# Patient Record
Sex: Male | Born: 1960
Health system: Southern US, Community
[De-identification: ages and names within clinical notes are randomized; demographics above are authoritative.]

## PROBLEM LIST (undated history)

## (undated) DIAGNOSIS — K259 Gastric ulcer, unspecified as acute or chronic, without hemorrhage or perforation: Secondary | ICD-10-CM

## (undated) DIAGNOSIS — E119 Type 2 diabetes mellitus without complications: Secondary | ICD-10-CM

## (undated) DIAGNOSIS — R011 Cardiac murmur, unspecified: Secondary | ICD-10-CM

## (undated) DIAGNOSIS — M7542 Impingement syndrome of left shoulder: Secondary | ICD-10-CM

## (undated) DIAGNOSIS — M75122 Complete rotator cuff tear or rupture of left shoulder, not specified as traumatic: Secondary | ICD-10-CM

## (undated) DIAGNOSIS — G8929 Other chronic pain: Secondary | ICD-10-CM

## (undated) DIAGNOSIS — Z7189 Other specified counseling: Secondary | ICD-10-CM

## (undated) DIAGNOSIS — I350 Nonrheumatic aortic (valve) stenosis: Secondary | ICD-10-CM

## (undated) DIAGNOSIS — E785 Hyperlipidemia, unspecified: Secondary | ICD-10-CM

## (undated) DIAGNOSIS — F17200 Nicotine dependence, unspecified, uncomplicated: Secondary | ICD-10-CM

## (undated) DIAGNOSIS — J449 Chronic obstructive pulmonary disease, unspecified: Secondary | ICD-10-CM

## (undated) DIAGNOSIS — M19012 Primary osteoarthritis, left shoulder: Secondary | ICD-10-CM

## (undated) DIAGNOSIS — K449 Diaphragmatic hernia without obstruction or gangrene: Secondary | ICD-10-CM

## (undated) DIAGNOSIS — M549 Dorsalgia, unspecified: Secondary | ICD-10-CM

## (undated) DIAGNOSIS — I10 Essential (primary) hypertension: Secondary | ICD-10-CM

## (undated) HISTORY — DX: Diaphragmatic hernia without obstruction or gangrene: K44.9

## (undated) HISTORY — DX: Hyperlipidemia, unspecified: E78.5

## (undated) HISTORY — DX: Type 2 diabetes mellitus without complications: E11.9

## (undated) HISTORY — DX: Nonrheumatic aortic (valve) stenosis: I35.0

## (undated) HISTORY — DX: Chronic obstructive pulmonary disease, unspecified: J44.9

## (undated) HISTORY — DX: Essential (primary) hypertension: I10

## (undated) HISTORY — PX: OTHER SURGICAL HISTORY: SHX169

---

## 1998-11-13 ENCOUNTER — Emergency Department (HOSPITAL_COMMUNITY): Admission: EM | Admit: 1998-11-13 | Discharge: 1998-11-13 | Payer: Self-pay | Admitting: Emergency Medicine

## 1999-02-24 ENCOUNTER — Emergency Department (HOSPITAL_COMMUNITY): Admission: EM | Admit: 1999-02-24 | Discharge: 1999-02-24 | Payer: Self-pay | Admitting: Emergency Medicine

## 1999-02-24 ENCOUNTER — Encounter: Payer: Self-pay | Admitting: Emergency Medicine

## 2000-05-05 ENCOUNTER — Ambulatory Visit (HOSPITAL_COMMUNITY): Admission: RE | Admit: 2000-05-05 | Discharge: 2000-05-05 | Payer: Self-pay | Admitting: *Deleted

## 2000-05-05 ENCOUNTER — Emergency Department (HOSPITAL_COMMUNITY): Admission: EM | Admit: 2000-05-05 | Discharge: 2000-05-05 | Payer: Self-pay | Admitting: *Deleted

## 2000-05-05 ENCOUNTER — Encounter: Payer: Self-pay | Admitting: *Deleted

## 2000-05-06 ENCOUNTER — Encounter (HOSPITAL_COMMUNITY): Admission: RE | Admit: 2000-05-06 | Discharge: 2000-08-04 | Payer: Self-pay | Admitting: *Deleted

## 2001-06-01 ENCOUNTER — Emergency Department (HOSPITAL_COMMUNITY): Admission: EM | Admit: 2001-06-01 | Discharge: 2001-06-01 | Payer: Self-pay | Admitting: Emergency Medicine

## 2002-08-27 ENCOUNTER — Encounter: Payer: Self-pay | Admitting: Emergency Medicine

## 2002-08-27 ENCOUNTER — Emergency Department (HOSPITAL_COMMUNITY): Admission: EM | Admit: 2002-08-27 | Discharge: 2002-08-27 | Payer: Self-pay | Admitting: Emergency Medicine

## 2002-11-12 ENCOUNTER — Emergency Department (HOSPITAL_COMMUNITY): Admission: EM | Admit: 2002-11-12 | Discharge: 2002-11-12 | Payer: Self-pay | Admitting: Emergency Medicine

## 2002-11-12 ENCOUNTER — Encounter: Payer: Self-pay | Admitting: Emergency Medicine

## 2002-12-26 ENCOUNTER — Emergency Department (HOSPITAL_COMMUNITY): Admission: EM | Admit: 2002-12-26 | Discharge: 2002-12-26 | Payer: Self-pay | Admitting: Emergency Medicine

## 2003-01-14 ENCOUNTER — Emergency Department (HOSPITAL_COMMUNITY): Admission: EM | Admit: 2003-01-14 | Discharge: 2003-01-14 | Payer: Self-pay | Admitting: Emergency Medicine

## 2004-10-05 ENCOUNTER — Emergency Department (HOSPITAL_COMMUNITY): Admission: EM | Admit: 2004-10-05 | Discharge: 2004-10-05 | Payer: Self-pay | Admitting: Emergency Medicine

## 2006-06-12 ENCOUNTER — Observation Stay (HOSPITAL_COMMUNITY): Admission: EM | Admit: 2006-06-12 | Discharge: 2006-06-13 | Payer: Self-pay | Admitting: *Deleted

## 2006-06-12 ENCOUNTER — Encounter: Payer: Self-pay | Admitting: Cardiology

## 2006-06-13 ENCOUNTER — Encounter: Payer: Self-pay | Admitting: Cardiology

## 2006-06-13 ENCOUNTER — Encounter: Payer: Self-pay | Admitting: Interventional Cardiology

## 2008-12-10 ENCOUNTER — Emergency Department (HOSPITAL_COMMUNITY): Admission: EM | Admit: 2008-12-10 | Discharge: 2008-12-10 | Payer: Self-pay | Admitting: Emergency Medicine

## 2009-01-03 ENCOUNTER — Emergency Department (HOSPITAL_COMMUNITY): Admission: EM | Admit: 2009-01-03 | Discharge: 2009-01-03 | Payer: Self-pay | Admitting: Emergency Medicine

## 2009-07-09 ENCOUNTER — Emergency Department (HOSPITAL_COMMUNITY): Admission: EM | Admit: 2009-07-09 | Discharge: 2009-07-09 | Payer: Self-pay | Admitting: Emergency Medicine

## 2009-12-23 ENCOUNTER — Emergency Department (HOSPITAL_COMMUNITY): Admission: EM | Admit: 2009-12-23 | Discharge: 2009-12-23 | Payer: Self-pay | Admitting: Emergency Medicine

## 2010-01-28 ENCOUNTER — Emergency Department (HOSPITAL_COMMUNITY): Admission: EM | Admit: 2010-01-28 | Discharge: 2010-01-28 | Payer: Self-pay | Admitting: Emergency Medicine

## 2010-02-16 ENCOUNTER — Emergency Department (HOSPITAL_COMMUNITY): Admission: EM | Admit: 2010-02-16 | Discharge: 2010-02-16 | Payer: Self-pay | Admitting: Emergency Medicine

## 2010-04-27 ENCOUNTER — Emergency Department (HOSPITAL_COMMUNITY): Admission: EM | Admit: 2010-04-27 | Discharge: 2010-04-27 | Payer: Self-pay | Admitting: Emergency Medicine

## 2010-05-13 ENCOUNTER — Encounter: Payer: Self-pay | Admitting: Cardiology

## 2010-06-30 ENCOUNTER — Encounter: Payer: Self-pay | Admitting: Cardiology

## 2010-07-13 ENCOUNTER — Encounter (INDEPENDENT_AMBULATORY_CARE_PROVIDER_SITE_OTHER): Payer: Self-pay | Admitting: *Deleted

## 2010-07-13 ENCOUNTER — Ambulatory Visit: Payer: Self-pay | Admitting: Cardiology

## 2010-07-13 DIAGNOSIS — E669 Obesity, unspecified: Secondary | ICD-10-CM

## 2010-07-13 DIAGNOSIS — F17200 Nicotine dependence, unspecified, uncomplicated: Secondary | ICD-10-CM

## 2010-07-13 DIAGNOSIS — I10 Essential (primary) hypertension: Secondary | ICD-10-CM

## 2010-07-13 DIAGNOSIS — R011 Cardiac murmur, unspecified: Secondary | ICD-10-CM

## 2010-07-13 DIAGNOSIS — R072 Precordial pain: Secondary | ICD-10-CM

## 2010-07-13 DIAGNOSIS — R0602 Shortness of breath: Secondary | ICD-10-CM

## 2010-07-13 DIAGNOSIS — Z87442 Personal history of urinary calculi: Secondary | ICD-10-CM

## 2010-07-20 ENCOUNTER — Telehealth (INDEPENDENT_AMBULATORY_CARE_PROVIDER_SITE_OTHER): Payer: Self-pay | Admitting: Radiology

## 2010-07-21 ENCOUNTER — Ambulatory Visit: Payer: Self-pay

## 2010-07-21 ENCOUNTER — Encounter (HOSPITAL_COMMUNITY): Admission: RE | Admit: 2010-07-21 | Discharge: 2010-10-02 | Payer: Self-pay | Admitting: Cardiology

## 2010-07-21 ENCOUNTER — Ambulatory Visit (HOSPITAL_COMMUNITY): Admission: RE | Admit: 2010-07-21 | Discharge: 2010-07-21 | Payer: Self-pay | Admitting: Cardiology

## 2010-07-21 ENCOUNTER — Encounter: Payer: Self-pay | Admitting: Cardiology

## 2010-07-21 ENCOUNTER — Ambulatory Visit: Payer: Self-pay | Admitting: Cardiology

## 2010-07-22 ENCOUNTER — Encounter: Payer: Self-pay | Admitting: Cardiology

## 2010-07-24 ENCOUNTER — Telehealth: Payer: Self-pay | Admitting: Cardiology

## 2010-08-05 ENCOUNTER — Encounter: Payer: Self-pay | Admitting: Cardiology

## 2010-08-10 ENCOUNTER — Ambulatory Visit: Payer: Self-pay | Admitting: Cardiology

## 2010-08-19 ENCOUNTER — Emergency Department (HOSPITAL_COMMUNITY): Admission: EM | Admit: 2010-08-19 | Discharge: 2010-08-19 | Payer: Self-pay | Admitting: Emergency Medicine

## 2010-11-09 ENCOUNTER — Emergency Department (HOSPITAL_COMMUNITY)
Admission: EM | Admit: 2010-11-09 | Discharge: 2010-11-09 | Payer: Self-pay | Source: Home / Self Care | Admitting: Emergency Medicine

## 2010-11-13 ENCOUNTER — Ambulatory Visit: Admit: 2010-11-13 | Payer: Self-pay | Admitting: Vascular Surgery

## 2010-11-16 LAB — URINALYSIS, ROUTINE W REFLEX MICROSCOPIC
Bilirubin Urine: NEGATIVE
Hgb urine dipstick: NEGATIVE
Ketones, ur: NEGATIVE mg/dL
Nitrite: NEGATIVE
Protein, ur: NEGATIVE mg/dL
Specific Gravity, Urine: >1.03 — ABNORMAL HIGH (ref 1.005–1.030)
Urine Glucose, Fasting: NEGATIVE mg/dL
Urobilinogen, UA: 0.2 mg/dL (ref 0.0–1.0)
pH: 5.5 (ref 5.0–8.0)

## 2010-11-22 ENCOUNTER — Encounter: Payer: Self-pay | Admitting: Physical Medicine and Rehabilitation

## 2010-11-22 ENCOUNTER — Emergency Department (HOSPITAL_COMMUNITY)
Admission: EM | Admit: 2010-11-22 | Discharge: 2010-11-22 | Payer: Self-pay | Source: Home / Self Care | Admitting: Emergency Medicine

## 2010-11-24 LAB — CBC
HCT: 46.1 % (ref 39.0–52.0)
Hemoglobin: 16.9 g/dL (ref 13.0–17.0)
MCH: 32.8 pg (ref 26.0–34.0)
MCHC: 36.7 g/dL — ABNORMAL HIGH (ref 30.0–36.0)
MCV: 89.3 fL (ref 78.0–100.0)
Platelets: 274 10*3/uL (ref 150–400)
RBC: 5.16 MIL/uL (ref 4.22–5.81)
RDW: 12 % (ref 11.5–15.5)
WBC: 14.2 10*3/uL — ABNORMAL HIGH (ref 4.0–10.5)

## 2010-11-24 LAB — COMPREHENSIVE METABOLIC PANEL
ALT: 36 U/L (ref 0–53)
AST: 24 U/L (ref 0–37)
Albumin: 4.5 g/dL (ref 3.5–5.2)
Alkaline Phosphatase: 65 U/L (ref 39–117)
BUN: 14 mg/dL (ref 6–23)
CO2: 27 mEq/L (ref 19–32)
Calcium: 10.3 mg/dL (ref 8.4–10.5)
Chloride: 100 mEq/L (ref 96–112)
Creatinine, Ser: 1.05 mg/dL (ref 0.4–1.5)
GFR calc Af Amer: >60 mL/min (ref >60)
GFR calc non Af Amer: >60 mL/min (ref >60)
Glucose, Bld: 107 mg/dL — ABNORMAL HIGH (ref 70–99)
Potassium: 3.9 mEq/L (ref 3.5–5.1)
Sodium: 136 mEq/L (ref 135–145)
Total Bilirubin: 0.8 mg/dL (ref 0.3–1.2)
Total Protein: 8 g/dL (ref 6.0–8.3)

## 2010-11-24 LAB — RAPID URINE DRUG SCREEN, HOSP PERFORMED
Amphetamines: NOT DETECTED
Barbiturates: NOT DETECTED
Benzodiazepines: NOT DETECTED
Cocaine: NOT DETECTED
Opiates: POSITIVE — AB
Tetrahydrocannabinol: NOT DETECTED

## 2010-11-24 LAB — D-DIMER, QUANTITATIVE: D-Dimer, Quant: 0.32 ug/mL-FEU (ref 0.00–0.48)

## 2010-11-24 LAB — TROPONIN I: Troponin I: 0.02 ng/mL (ref 0.00–0.06)

## 2010-11-24 LAB — CK TOTAL AND CKMB (NOT AT ARMC)
CK, MB: 2.8 ng/mL (ref 0.3–4.0)
Relative Index: 1.4 (ref 0.0–2.5)
Total CK: 202 U/L (ref 7–232)

## 2010-12-01 ENCOUNTER — Other Ambulatory Visit: Payer: Self-pay | Admitting: Family Medicine

## 2010-12-01 DIAGNOSIS — M545 Low back pain: Secondary | ICD-10-CM

## 2010-12-01 NOTE — Assessment & Plan Note (Signed)
Summary: Nuclear Study     Nuclear Med Background Indications for Stress Test: Evaluation for Ischemia   History: Myocardial Perfusion Study  History Comments: 2007-MPS- No ischemia. EF= 56%  Symptoms: Chest Pain, Chest Pain with Exertion, Chest Tightness with Exertion, Diaphoresis, Dizziness, DOE    Nuclear Pre-Procedure Cardiac Risk Factors: Family History - CAD, Hypertension, Smoker Caffeine/Decaff Intake: None NPO After: 6:00 PM Lungs: clear IV 0.9% NS with Angio Cath: 22g     IV Site: R Hand IV Started by: Irean Hong, RN Chest Size (in) 54     Height (in): 73 Weight (lb): 268 BMI: 35.49 Tech Comments: Held metoprolol 24 hrs.  Nuclear Med Study 1 or 2 day study:  1 day     Stress Test Type:  Treadmill/Lexiscan Reading MD:  Marca Ancona, MD     Referring MD:  J. Hochrein,MD Resting Radionuclide:  Technetium 13m Tetrofosmin     Resting Radionuclide Dose:  10.9 mCi  Stress Radionuclide:  Technetium 63m Tetrofosmin     Stress Radionuclide Dose:  33.0 mCi   Stress Protocol Exercise Time (min):  2:00 min     Max HR:  121 bpm     Predicted Max HR:  171 bpm  Max Systolic BP: 184 mm Hg     Percent Max HR:  70.76 %     METS: 1.6 Rate Pressure Product:  [address]  Lexiscan: 0.4 mg   Stress Test Technologist:  Cathlyn Parsons, RN     Nuclear Technologist:  Doyne Keel, CNMT  Rest Procedure  Myocardial perfusion imaging was performed at rest 45 minutes following the intravenous administration of Technetium 78m Tetrofosmin.  Stress Procedure  The patient received IV Lexiscan 0.4 mg over 15-seconds with concurrent low level exercise and then Technetium 62m Tetrofosmin was injected at 30-seconds while the patient continued walking one more minute.  There were no significant changes with Lexiscan. Patient had chest pressure 6-7/10 and SOB with infusion.  Symptoms relieved in recovery.  Patient had mild hypertensive response.  Quantitative spect images were obtained after a 45  minute delay.  QPS Raw Data Images:  Mild diaphragmatic attenuation.  Normal left ventricular size. Stress Images:  Normal homogeneous uptake in all areas of the myocardium. Rest Images:  Normal homogeneous uptake in all areas of the myocardium. Subtraction (SDS):  There is no evidence of scar or ischemia. Transient Ischemic Dilatation:  1.03  (Normal <1.22)  Lung/Heart Ratio:  .35  (Normal <0.45)  Quantitative Gated Spect Images QGS EDV:  130 ml QGS ESV:  47 ml QGS EF:  64 % QGS cine images:  Normal wall motion.    Overall Impression  Exercise Capacity: Lexiscan with no exercise. BP Response: Normal blood pressure response. Clinical Symptoms: Shortness of breath and chest pressure.  ECG Impression: No significant ST segment change suggestive of ischemia. Overall Impression: Normal stress nuclear study.  Appended Document: Cardiology Nuclear Testing No evidence of ischemia or infarct.  Needs primary risk reduction.  Appended Document: Nuclear Study    pt aware of results

## 2010-12-01 NOTE — Progress Notes (Signed)
Summary: stress test results  Phone Note Call from Patient Call back at Home Phone [phone]   Caller: pt Reason for Call: Lab or Test Results Summary of Call: pt was sent to [facility] for stress test by dr hochrien and would like the results. Initial call taken by: Faythe Ghee,  July 24, 2010 11:59 AM  Follow-up for Phone Call        Pt notified he will be notified of results once Dr. Antoine Poche reviews test. Pt states "so if I have a heart attack in the mean time, I should sue him, right?" Pt notified if he has chest pain, SOB or any symptoms or problems concerning of a heart attack, he should go to the ER for evaluation. Pt verbalized understanding.  Follow-up by: Cyril Loosen, RN, BSN,  July 24, 2010 3:16 PM

## 2010-12-01 NOTE — Miscellaneous (Signed)
Clinical Lists Changes  Observations: Added new observation of ECHOINTERP:  - Left ventricle: The cavity size was normal. Wall thickness was       increased in a pattern of mild LVH. Systolic function was normal.       The estimated ejection fraction was in the range of 60% to 65%.       Wall motion was normal; there were no regional wall motion       abnormalities. Doppler parameters are consistent with abnormal       left ventricular relaxation (grade 1 diastolic dysfunction).     - Aortic valve: There was mild stenosis. Trivial regurgitation. Mean       gradient: 15mm Hg (S). Peak gradient: 23mm Hg (S). Valve area:       2.24cm 2(VTI).     - Aorta: Borderline dilated aortic root.     - Mitral valve: No significant regurgitation.     - Right ventricle: The cavity size was normal. Systolic function was       normal.     - Pulmonary arteries: No complete TR doppler jet so unable to       estimate PA systolic pressure.     - Inferior vena cava: The vessel was normal in size; the       respirophasic diameter changes were in the normal range (= 50%);       findings are consistent with normal central venous pressure.     Impressions:            - Normal LV szie and systolic function, EF 60-65%. Mild LV       hypertrophy. Mild aortic stenosis. Normal RV size and systolic       function. (07/21/2010 11:33) Added new observation of NUCLEAR NOS: Exercise Capacity: Lexiscan with no exercise. BP Response: Normal blood pressure response. Clinical Symptoms: Shortness of breath and chest pressure.  ECG Impression: No significant ST segment change suggestive of ischemia. Overall Impression: Normal stress nuclear study.   (07/21/2010 11:32)      Nuclear Study  Procedure date:  07/21/2010  Findings:      Exercise Capacity: Lexiscan with no exercise. BP Response: Normal blood pressure response. Clinical Symptoms: Shortness of breath and chest pressure.  ECG Impression: No significant  ST segment change suggestive of ischemia. Overall Impression: Normal stress nuclear study.    Echocardiogram  Procedure date:  07/21/2010  Findings:       - Left ventricle: The cavity size was normal. Wall thickness was       increased in a pattern of mild LVH. Systolic function was normal.       The estimated ejection fraction was in the range of 60% to 65%.       Wall motion was normal; there were no regional wall motion       abnormalities. Doppler parameters are consistent with abnormal       left ventricular relaxation (grade 1 diastolic dysfunction).     - Aortic valve: There was mild stenosis. Trivial regurgitation. Mean       gradient: 15mm Hg (S). Peak gradient: 23mm Hg (S). Valve area:       2.24cm 2(VTI).     - Aorta: Borderline dilated aortic root.     - Mitral valve: No significant regurgitation.     - Right ventricle: The cavity size was normal. Systolic function was       normal.     - Pulmonary arteries: No complete  TR doppler jet so unable to       estimate PA systolic pressure.     - Inferior vena cava: The vessel was normal in size; the       respirophasic diameter changes were in the normal range (= 50%);       findings are consistent with normal central venous pressure.     Impressions:            - Normal LV szie and systolic function, EF 60-65%. Mild LV       hypertrophy. Mild aortic stenosis. Normal RV size and systolic       function.

## 2010-12-01 NOTE — Letter (Signed)
Summary: External Correspondence/ [facility]  External Correspondence/ [facility]   Imported By: Dorise Hiss 07/01/2010 15:29:18  _____________________________________________________________________  External Attachment:    Type:   Image     Comment:   External Document

## 2010-12-01 NOTE — Progress Notes (Signed)
Summary: Nuc Pre-Procedure  Phone Note Outgoing Call Call back at [facility] Phone [phone]   Call placed by: Leonia Corona, RT-N,  July 20, 2010 3:56 PM Call placed to: Patient Reason for Call: Confirm/change Appt Summary of Call: Left message with information on [employer] Information Sheet (see scanned document for details).      Nuclear Med Background Indications for Stress Test: Evaluation for Ischemia   History: Myocardial Perfusion Study  History Comments: 2007-[facility]PS- No ischemia. EF= 56%  Symptoms: Chest Pain, Chest Tightness with Exertion, Diaphoresis, DOE    Nuclear Pre-Procedure Cardiac Risk Factors: Family History - CAD, Hypertension, Smoker Height (in): 73

## 2010-12-01 NOTE — Letter (Signed)
Summary: Lexiscan or Dobutamine Pharmacist, community at [facility]  [address]   [place], [place] [address]   Phone: [phone]  Fax: [phone]      [facility]  Lexiscan or Dobutamine Cardiolite Strss Test    Gerald Mccann  Appointment Date:_  Appointment Time:_  Your doctor has ordered a CARDIOLITE STRESS TEST using a medication to stimulate exercise so that you will not have to walk on the treadmill to determine the condition of your heart during stress. If you take blood pressure medication, ask your doctor if you should take it the day of your test. You should not have anything to eat or drink at least 4 hours before your test is scheduled, and no caffeine, including decaffeinated tea and coffee, chocolate, and soft drinks for 24 hours before your test.  You will need to register at the Outpatient/Main Entrance at the hospital 15 minutes before your appointment time. It is a good idea to bring a copy of your order with you. They will direct you to the Diagnostic Imaging (Radiology) Department.  You will be asked to undress from the waist up and given a hospital gown to wear, so dress comfortably from the waist down for example: Sweat pants, shorts, or skirt Rubber soled lace up shoes (tennis shoes)  Plan on about three hours from registration to release from the hospital

## 2010-12-01 NOTE — Assessment & Plan Note (Signed)
Summary: NP-CHEST PAIN/HIGH RISK   Visit Type:  Initial Consult Primary Provider:  [facility] [place]  CC:  Claudication and SOB.  History of Present Illness: The patient presents for evaluation of the above complaints. He has no past cardiac history though he had a stress perfusion study in 2007 demonstrating EF of 56% with no evidence of ischemia or infarct. He apparently had dyspnea at that time. He recently saw his primary care doctor with complaints of dyspnea and was treated for pneumonia. However, before this diagnosis and since he has had increasing shortness of breath. This has been going on for about 18 months and is slowly progressive. He'll get short of breath walking a moderate distance on level ground. He'll get some diaphoresis. He did have some resting complaints with chest discomfort about 2 months ago before being started on aspirin. He has had no resting symptoms since then. When he does get short of breath with activity he might have some mild chest tightness. He does not describe jaw or arm discomfort. His symptoms improve after one to 2 minutes of exercise. He sleeps on one pillow and does not describe PND or orthopnea. He has been told by his wife that he has "jerking legs". He does complain of erectile dysfunction. He does describe leg pain with walking as well.  Preventive Screening-Counseling & Management  Alcohol-Tobacco     Smoking Status: current     Smoking Cessation Counseling: yes     Packs/Day: 1 PPD  Current Medications (verified): 1)  Lisinopril-Hydrochlorothiazide 10-12.5 Mg Tabs (Lisinopril-Hydrochlorothiazide) .... Take 1 Tablet By Mouth Once A Day 2)  Ibuprofen 600 Mg Tabs (Ibuprofen) .... Take 1 Tablet By Mouth Three Times A Day 3)  Aspir-Low 81 Mg Tbec (Aspirin) .... Take 1 Tablet By Mouth Once A Day 4)  Sm Acid Reducer 75 Mg Tabs (Ranitidine Hcl) .... Take 1 Tablet By Mouth Three Times A Day 5)  Goodys Pm 38-500 Mg Pack  (Diphenhydramine-Apap (Sleep)) .... Take 2 Tablet By Mouth Once A Day 6)  Metoprolol Succinate 25 Mg Xr24h-Tab (Metoprolol Succinate) .... Take 1 Tablet By Mouth Once A Day  Allergies (verified): No Known Drug Allergies  Comments:  Nurse/Medical Assistant: The patient's medication list and allergies were reviewed with the patient and were updated in the Medication and Allergy Lists.  Past History:  Past Medical History: Nephrohthiosis Hypertension x years Hiatal Hernia  Past Surgical History: Right knee ligament surgery  Family History: Maternal hx of asthma Father CAD,died age 20 of a MI Maternal Hx of CAD, no premature death Brother died of MI age 17  Social History: Tobacco Use - 1 ppd x 38 years Alcohol use 1-2 beers q month Caffeine Use 2-3 cups once daily No drug use Married, 5 children (ages, 60, 19, 1, 41, 2) Packs/Day:  1 PPD  Review of Systems       Positive for headaches, tinnitus, weakness, dizziness, leg pain, heartburn, nephrolithiasis, joint pains, erectile dysfunction. Otherwise as stated in the history of present illness negative for all other systems.  Vital Signs:  Patient profile:   50 year old male Height:      73 inches Weight:      257 pounds BMI:     34.03 Pulse rate:   69 / minute BP sitting:   168 / 100  (left arm) Cuff size:   large  Vitals Entered By: Carlye Grippe (July 13, 2010 2:15 PM)  Nutrition Counseling: Patient's BMI is greater than 25  and therefore counseled on weight management options. CC: Claudication, SOB   Physical Exam  General:  Well developed, well nourished, in no acute distress. Head:  normocephalic and atraumatic Eyes:  PERRLA/EOM intact; conjunctiva and lids normal. Mouth:  Teeth, gums and palate normal. Oral mucosa normal. Neck:  Neck supple, no JVD. No masses, thyromegaly or abnormal cervical nodes. Chest Wall:  no deformities or breast masses noted Lungs:  Clear bilaterally to auscultation and  percussion. Abdomen:  Bowel sounds positive; abdomen soft and non-tender without masses, organomegaly, or hernias noted. No hepatosplenomegaly, obese Msk:  Back normal, normal gait. Muscle strength and tone normal. Extremities:  No clubbing or cyanosis. Neurologic:  Alert and oriented x 3. Skin:  Intact without lesions or rashes. Cervical Nodes:  no significant adenopathy Axillary Nodes:  no significant adenopathy Inguinal Nodes:  no significant adenopathy Psych:  Normal affect.   Detailed Cardiovascular Exam  Neck    Carotids: Carotids full and equal bilaterally without bruits.      Neck Veins: Normal, no JVD.    Heart    Inspection: no deformities or lifts noted.      Palpation: normal PMI with no thrills palpable.      Auscultation: S1 and S2 within normal limits, no S3, no S4, no clicks, no rubs, 2/6 early peaking systolic murmur heard out the aortic outflow tract, no diastolic murmurs.  Vascular    Abdominal Aorta: no palpable masses, pulsations, or audible bruits.      Femoral Pulses: normal femoral pulses bilaterally.      Pedal Pulses: normal pedal pulses bilaterally.      Radial Pulses: normal radial pulses bilaterally.      Peripheral Circulation: no clubbing, cyanosis, or edema noted with normal capillary refill.     EKG  Procedure date:  07/13/2010  Findings:      Sinus rhythm, rate 67, axis within normal limits, intervals within normal limits, no acute ST-T wave changes  Impression & Recommendations:  Problem # 1:  SHORTNESS OF BREATH (ICD-786.05)  The patient describes dyspnea and some chest discomfort. He has very significant cardiovascular risk factors. The pretest probability of obstructive coronary disease is at least moderately high. Screening with a stress perfusion study is indicated. He would not be able to walk on a treadmill and so we'll have a pharmacologic stress perfusion study.  He needs aggressive risk reduction. We had a long conversation about  his high risk for future cardiovascular event regardless of the outcomes of this study. He'll most definitely has some coronary plaque and would be at higher risk for plaque rupture with his lifestyle and risk factors.  Orders: Nuclear Med (Nuc Med) 2-D Echocardiogram (2D Echo)  Problem # 2:  ESSENTIAL HYPERTENSION, BENIGN (ICD-401.1) I'm going to add Toprol 25 mg daily and titrate as needed for cardioprotective effects as well as treatment of blood pressure. He needs to keep a blood pressure diary. He says his blood pressure is better controlled when not in the doctor's office. He needs therapeutic lifestyle changes to include weight loss and salt restriction.  Problem # 3:  TOBACCO USER (ICD-305.1) We had a long discussion about the need to stop smoking. He quit once before on Chantix.  We discussed the new warning of increased MI potential. He would be willing to accept this risk in order to quit smoking as he knows nothing else has helped him in the past. I would write this prescription with his understanding of the risk including suicidal ideation  and depression but will wait until we have completed the above workup. I do think smoking cessation is critical to his well-being in the future.  Problem # 4:  MURMUR (ICD-785.2)  I will check an echocardiogram to evaluate this.  Orders: Nuclear Med (Nuc Med) 2-D Echocardiogram (2D Echo)  Patient Instructions: 1)  Lexiscan cardiolite  2)  Echo  3)  Above testing to be done at [facility] 4)  Metoprolol 25mg  daily 5)  Follow up in [employer] office also as he works near office and lives closer to them. Prescriptions: METOPROLOL SUCCINATE 25 MG XR24H-TAB (METOPROLOL SUCCINATE) Take 1 tablet by mouth once a day  #30 x 6   Entered by:   Hoover Brunette, LPN   Authorized by:   Rollene Rotunda, MD, [facility]   Signed by:   Hoover Brunette, LPN on 16/08/9603   Method used:   [facility]        [facility]  [place] [address]retail[facility]       [address]       [place], [place]  [address]       Ph: [phone] or [phone]       Fax: [phone]   RxID:   [id number]   Handout requested.

## 2010-12-01 NOTE — Letter (Signed)
Summary: External Correspondence/ [facility]  External Correspondence/ [facility]   Imported By: Dorise Hiss 07/01/2010 15:30:33  _____________________________________________________________________  External Attachment:    Type:   Image     Comment:   External Document

## 2010-12-01 NOTE — Assessment & Plan Note (Signed)
Summary: PER CHECK OUT/SF   Visit Type:  Follow-up Primary Provider:  [facility] [place]  CC:  Dyspnea.  History of Present Illness: The patient presents for followup of dyspnea. After the last visit I sent him for a stress study. This demonstrated no evidence of ischemia or infarct with a well-preserved ejection fraction. He also had a murmur and I sent him for an echo which demonstrated some mild aortic stenosis. He returns today for followup and says that his dyspnea is essentially unchanged. Is not having any acute resting symptoms. He denies any PND or orthopnea. He has had no chest pressure, neck or arm discomfort. He has had no palpitations, presyncope or syncope. He has had no weight gain or edema.  Current Medications (verified): 1)  Lisinopril-Hydrochlorothiazide 10-12.5 Mg Tabs (Lisinopril-Hydrochlorothiazide) .... Take 1 Tablet By Mouth Once A Day 2)  Ibuprofen 600 Mg Tabs (Ibuprofen) .... Take 1 Tablet By Mouth Three Times A Day 3)  Aspir-Low 81 Mg Tbec (Aspirin) .... Take 1 Tablet By Mouth Once A Day 4)  Sm Acid Reducer 75 Mg Tabs (Ranitidine Hcl) .... Take 1 Tablet By Mouth Three Times A Day 5)  Goodys Pm 38-500 Mg Pack (Diphenhydramine-Apap (Sleep)) .... Take 2 Tablet By Mouth Once A Day 6)  Metoprolol Succinate 25 Mg Xr24h-Tab (Metoprolol Succinate) .... Take 1 Tablet By Mouth Once A Day  Allergies (verified): No Known Drug Allergies  Past History:  Past Medical History: Nephrohthiosis Hypertension x years Hiatal Hernia Tobacco abuse  Past Surgical History: Reviewed history from 07/13/2010 and no changes required. Right knee ligament surgery  Review of Systems       As stated in the HPI and negative for all other systems.   Vital Signs:  Patient profile:   50 year old male Height:      73 inches Weight:      259 pounds BMI:     34.29 Pulse rate:   69 / minute Resp:     16 per minute BP sitting:   144 / 72  (right arm)  Vitals  Entered By: Marrion Coy, CNA (August 10, 2010 4:00 PM)  Physical Exam  General:  Well developed, well nourished, in no acute distress. Head:  normocephalic and atraumatic Eyes:  PERRLA/EOM intact; conjunctiva and lids normal. Mouth:  Teeth, gums and palate normal. Oral mucosa normal. Neck:  Neck supple, no JVD. No masses, thyromegaly or abnormal cervical nodes. Chest Wall:  no deformities or breast masses noted Lungs:  Clear bilaterally to auscultation and percussion. Abdomen:  Bowel sounds positive; abdomen soft and non-tender without masses, organomegaly, or hernias noted. No hepatosplenomegaly, obese Msk:  Back normal, normal gait. Muscle strength and tone normal. Extremities:  No clubbing or cyanosis. Neurologic:  Alert and oriented x 3. Skin:  Intact without lesions or rashes. Cervical Nodes:  no significant adenopathy Psych:  Normal affect.   Detailed Cardiovascular Exam  Neck    Carotids: Carotids full and equal bilaterally without bruits.      Neck Veins: Normal, no JVD.    Heart    Inspection: no deformities or lifts noted.      Palpation: normal PMI with no thrills palpable.      Auscultation: S1 and S2 within normal limits, no S3, no S4, no clicks, no rubs, 2/6 early peaking systolic murmur heard out the aortic outflow tract, no diastolic murmurs.  Vascular    Abdominal Aorta: no palpable masses, pulsations, or audible bruits.  Femoral Pulses: normal femoral pulses bilaterally.      Pedal Pulses: normal pedal pulses bilaterally.      Radial Pulses: normal radial pulses bilaterally.      Peripheral Circulation: no clubbing, cyanosis, or edema noted with normal capillary refill.     Impression & Recommendations:  Problem # 1:  MURMUR (ICD-785.2) The patient has some mild aortic stenosis. This should be followed with periodic physical examinations and echocardiogram based on any change in the murmur or future symptoms. This is not contributing to his  dyspnea.  Problem # 2:  TOBACCO USER (ICD-305.1) We again had a long discussion about the need to stop smoking (greater than 3 minutes). He understands the importance of this. He wants to take Chantix and has tolerated this before. He is aware of the potential including GI upset, depression and suicidal ideation. He understands the risks benefits including a possible slight increase in myocardial infarction but chooses to take the medication.  Problem # 3:  SHORTNESS OF BREATH (ICD-786.05) I would suspect no cardiac etiology for his dyspnea but rather a primary pulmonary process probably related to tobacco. He will follow with his primary physicians to consider further evaluation or management.  Patient Instructions: 1)  Your physician recommends that you schedule a follow-up appointment in: 24 months with Dr Antoine Poche 2)  Your physician has recommended you make the following change in your medication: start Chantix as directed 3)  Your physician discussed the hazards of tobacco use.  Tobacco use cessation is recommended and techniques and options to help you quit were discussed. Prescriptions: CHANTIX 1 MG TABS (VARENICLINE TARTRATE) one twice a day  #60 x 4   Entered by:   Charolotte Capuchin, RN   Authorized by:   Rollene Rotunda, MD, [facility]   Signed by:   Charolotte Capuchin, RN on 08/10/2010   Method used:   [facility]  [place] [address]       [address]       [place], [place]  [address]       Ph: [phone] or [phone]       Fax: [phone]   RxID:   [id number] CHANTIX STARTING MONTH PAK 0.5 MG X 11 & 1 MG X 42 TABS (VARENICLINE TARTRATE) as directed  #1 x 0   Entered by:   Charolotte Capuchin, RN   Authorized by:   Rollene Rotunda, MD, [facility]   Signed by:   Charolotte Capuchin, RN on 08/10/2010   Method used:   [facility]  [place] [address] [facility]       [address]       [place], [place]  [address]       Ph: [phone]  or [phone]       Fax: [phone]   RxID:   [id number]  I have reviewed and approved all prescriptions at the time of this visit.

## 2010-12-02 ENCOUNTER — Encounter: Payer: Self-pay | Admitting: Family Medicine

## 2010-12-05 ENCOUNTER — Ambulatory Visit
Admission: RE | Admit: 2010-12-05 | Discharge: 2010-12-05 | Disposition: A | Discharge status: Home / Self Care | Payer: Managed Care, Other (non HMO) | Source: Ambulatory Visit | Attending: Family Medicine | Admitting: Family Medicine

## 2010-12-05 DIAGNOSIS — M545 Low back pain: Secondary | ICD-10-CM

## 2010-12-10 ENCOUNTER — Other Ambulatory Visit: Payer: Self-pay | Admitting: Family Medicine

## 2010-12-10 ENCOUNTER — Ambulatory Visit
Admission: RE | Admit: 2010-12-10 | Discharge: 2010-12-10 | Disposition: A | Discharge status: Home / Self Care | Payer: Managed Care, Other (non HMO) | Source: Ambulatory Visit | Attending: Family Medicine | Admitting: Family Medicine

## 2010-12-10 DIAGNOSIS — M545 Low back pain: Secondary | ICD-10-CM

## 2011-01-13 LAB — POCT I-STAT, CHEM 8
BUN: 13 mg/dL (ref 6–23)
Calcium, Ion: 1.15 mmol/L (ref 1.12–1.32)
Creatinine, Ser: 0.9 mg/dL (ref 0.4–1.5)
TCO2: 23 mmol/L (ref 0–100)

## 2011-01-13 LAB — POCT CARDIAC MARKERS
Myoglobin, poc: 44.8 ng/mL (ref 12–200)
Myoglobin, poc: 50.4 ng/mL (ref 12–200)
Troponin i, poc: <0.05 ng/mL (ref 0.00–0.09)

## 2011-01-17 LAB — CBC
Hemoglobin: 13.7 g/dL (ref 13.0–17.0)
MCH: 32.8 pg (ref 26.0–34.0)
MCHC: 35.1 g/dL (ref 30.0–36.0)
MCV: 93.4 fL (ref 78.0–100.0)

## 2011-01-17 LAB — POCT I-STAT, CHEM 8
Calcium, Ion: 1.16 mmol/L (ref 1.12–1.32)
Creatinine, Ser: 0.9 mg/dL (ref 0.4–1.5)
Glucose, Bld: 79 mg/dL (ref 70–99)
HCT: 41 % (ref 39.0–52.0)
Hemoglobin: 13.9 g/dL (ref 13.0–17.0)
TCO2: 23 mmol/L (ref 0–100)

## 2011-01-17 LAB — URINALYSIS, ROUTINE W REFLEX MICROSCOPIC
Bilirubin Urine: NEGATIVE
Glucose, UA: NEGATIVE mg/dL
Ketones, ur: NEGATIVE mg/dL
Leukocytes, UA: NEGATIVE
Protein, ur: NEGATIVE mg/dL

## 2011-01-17 LAB — DIFFERENTIAL
Basophils Relative: 1 % (ref 0–1)
Eosinophils Absolute: 0.3 10*3/uL (ref 0.0–0.7)
Eosinophils Relative: 4 % (ref 0–5)
Monocytes Absolute: 0.7 10*3/uL (ref 0.1–1.0)
Monocytes Relative: 9 % (ref 3–12)
Neutrophils Relative %: 49 % (ref 43–77)

## 2011-01-19 LAB — URINE MICROSCOPIC-ADD ON

## 2011-01-19 LAB — URINALYSIS, ROUTINE W REFLEX MICROSCOPIC
Bilirubin Urine: NEGATIVE
Ketones, ur: NEGATIVE mg/dL
Nitrite: NEGATIVE
Urobilinogen, UA: 0.2 mg/dL (ref 0.0–1.0)

## 2011-01-20 LAB — URINALYSIS, ROUTINE W REFLEX MICROSCOPIC
Glucose, UA: NEGATIVE mg/dL
Ketones, ur: NEGATIVE mg/dL
Nitrite: NEGATIVE
Protein, ur: NEGATIVE mg/dL
pH: 5.5 (ref 5.0–8.0)

## 2011-02-05 LAB — URINALYSIS, ROUTINE W REFLEX MICROSCOPIC
Bilirubin Urine: NEGATIVE
Ketones, ur: NEGATIVE mg/dL
Leukocytes, UA: NEGATIVE
Nitrite: NEGATIVE
Specific Gravity, Urine: 1.01 (ref 1.005–1.030)
Urobilinogen, UA: 0.2 mg/dL (ref 0.0–1.0)
pH: 6 (ref 5.0–8.0)

## 2011-02-05 LAB — URINE MICROSCOPIC-ADD ON

## 2011-03-19 NOTE — H&P (Signed)
Gerald Mccann, Gerald Mccann               ACCOUNT NO.:  [id number]   MEDICAL RECORD NO.:  [id number]          PATIENT TYPE:  OBV   LOCATION:  0103                         FACILITY:  [facility]   PHYSICIAN:  Hollice Espy, M.D.DATE OF BIRTH:  09-Feb-1961   DATE OF ADMISSION:  06/12/2006  DATE OF DISCHARGE:                                HISTORY & PHYSICAL   CONSULTANTS ON THE CASE:  Corky Crafts, MD, [facility].   CHIEF COMPLAINT:  Chest pain.   PCP:  Previously was seen at Dr. Collins Scotland, but has not seen her in almost 6  months now.   HISTORY OF PRESENT ILLNESS:  The patient is a 50 year old white male with a  past medical history of hypertension, obesity, tobacco and alcohol abuse who  presents to the emergency room after an episode of chest pain.  He tells me  that he had some previous episodes of chest pain 8 years ago which was  evaluated.  He also has a diagnosis of hypertension but stopped taking his  medicines approximately 1 month ago.  He has been doing well with no  complaints and then this morning woke up with sudden onset severe left sided  chest pain underneath his left breast with no radiation.  Initially he  described it almost as a pop and a sharp pain but it continued to persist.  He had associated shortness of breath a well as some dizziness and nausea.  These symptoms persisted and he came in to the emergency room for further  evaluation.  His symptoms started to ease once he was given a nitroglycerin  and then once he got some morphine his chest pain and discomfort now came  down to less than a 1.  At the time when it was severe it was close to a 10.  In addition, also of note, when he first came into the emergency room his  blood pressure was noted to be in the 230's and this came down on its own  with relief.  Patient is currently doing well.  He feels slightly anxious  but he denies any headaches, vision changes, dysphagia, chest pain,  palpitation,  shortness of breath, wheeze, cough, abdominal pain, hematuria,  dysuria, constipation, diarrhea, focal extremity numbness, weakness or pain.  His review of systems is otherwise negative.   PAST MEDICAL HISTORY:  1. Hypertension.  2. Obesity.  3. Tobacco abuse.  4. Binge drinking.  5. Alcohol.  6. Medical noncompliance.  7. Severe GERD.   MEDICATIONS:  1. Stopped taking his blood pressure medicines 1 month ago.  2. He takes p.r.n. antacid.   ALLERGIES:  NO KNOWN DRUG ALLERGIES.   SOCIAL HISTORY:  Smokes a pack to a pack and a half a day for the last 30  years, drinks heavily maybe about a case of beer on the weekend.   FAMILY HISTORY:  Noted for Mom and Dad both MIs in the 38's.  Brother who  has already had 3 MIs and he is just a few years older.   PHYSICAL EXAM:  VITALS ON ADMISSION:  Temperature 99,  heart rate 82, blood  pressure initially 234/109, but since then has come down to as low as  134/75, now slightly elevated at 157/86, respirations 20, O2 sat 97% on room  air.  GENERAL:  The patient is alert and oriented x3, in no apparent distress.  HEENT:  Normocephalic, atraumatic.  His mucous membranes are moist.  He has  no carotid bruits.  HEART:  Regular rate and rhythm, S1, S2.  LUNGS:  Clear to auscultation bilaterally.  ABDOMEN:  Soft, obese, nontender.  Positive bowel sounds.  EXTREMITIES:  No clubbing, cyanosis or edema.   LAB WORK:  EKG is normal sinus rhythm.  Sodium 136, potassium 3.6, chloride  104, bicarbonate 24.  BUN 11, creatinine 0.9, glucose 109, calcium normal.  White count 10.2, no shift.  H&H 16.4 and 46.2, MCV of 92, platelet count  262.  Cardiac enzyme:  First set:  CPK 54.7, MB 1.1, troponin-I less than  0.05.  Second set:  CPK 53.6, MB 1.2, troponin-I less than 0.05.   ASSESSMENT AND PLAN:  1. Slightly atypical chest pain in patient with high risk factors.  I have      spoken with Dr. Eldridge Dace, of [facility].  The plan will be to get       the patient an adenosine Cardiolite stress test in the morning.  He has      a bad knee and unable to run on a treadmill.  In the meantime, will      check two more sets of cardiac enzyme markers to rule him out and check      a fasting lipid profile in the morning.  2. Anxiety.  Patient feels slightly anxious.  Will put him on p.r.n.      Xanax.  3. Tobacco abuse.  Continue nicotine patch.  4. Binge drinking.  Have counseled the patient on this.  5. Hypertension.  Following negative stress test would restart him on an      antihypertensive regimen.  6. Obesity.  Counseled the patient on weight loss.  7. Medical noncompliance.  8. Gastroesophageal reflux disease.  Continue p.r.n. antacids.      Hollice Espy, M.D.  Electronically Signed     SKK/MEDQ  D:  06/12/2006  T:  06/12/2006  Job:  [id number]   cc:   Corky Crafts, MD  Fax: [phone]   Tammy R. Collins Scotland, M.D.  Fax: [phone]

## 2011-09-21 ENCOUNTER — Other Ambulatory Visit: Payer: Self-pay | Admitting: Neurosurgery

## 2011-09-21 DIAGNOSIS — M549 Dorsalgia, unspecified: Secondary | ICD-10-CM

## 2011-09-21 DIAGNOSIS — M545 Low back pain: Secondary | ICD-10-CM

## 2011-09-25 ENCOUNTER — Ambulatory Visit
Admission: RE | Admit: 2011-09-25 | Discharge: 2011-09-25 | Disposition: A | Discharge status: Home / Self Care | Payer: Managed Care, Other (non HMO) | Source: Ambulatory Visit | Attending: Neurosurgery | Admitting: Neurosurgery

## 2011-09-25 DIAGNOSIS — M549 Dorsalgia, unspecified: Secondary | ICD-10-CM

## 2012-05-25 ENCOUNTER — Encounter: Payer: Self-pay | Admitting: *Deleted

## 2013-05-29 ENCOUNTER — Ambulatory Visit: Payer: Self-pay

## 2015-06-27 ENCOUNTER — Encounter: Payer: Self-pay | Admitting: Cardiovascular Disease

## 2015-06-27 ENCOUNTER — Ambulatory Visit (INDEPENDENT_AMBULATORY_CARE_PROVIDER_SITE_OTHER): Payer: Medicaid Other | Admitting: Cardiovascular Disease

## 2015-06-27 VITALS — BP 178/80 | HR 71 | Ht 73.0 in | Wt 251.0 lb

## 2015-06-27 DIAGNOSIS — R079 Chest pain, unspecified: Secondary | ICD-10-CM | POA: Diagnosis not present

## 2015-06-27 DIAGNOSIS — I10 Essential (primary) hypertension: Secondary | ICD-10-CM

## 2015-06-27 DIAGNOSIS — I350 Nonrheumatic aortic (valve) stenosis: Secondary | ICD-10-CM

## 2015-06-27 DIAGNOSIS — I77810 Thoracic aortic ectasia: Secondary | ICD-10-CM | POA: Diagnosis not present

## 2015-06-27 DIAGNOSIS — Z716 Tobacco abuse counseling: Secondary | ICD-10-CM

## 2015-06-27 MED ORDER — LISINOPRIL 20 MG PO TABS: 20.0000 mg | ORAL_TABLET | Freq: Every day | ORAL | Status: DC

## 2015-06-27 NOTE — Progress Notes (Signed)
Patient ID: Gerald Mccann, male   DOB: [birth year], 55 y.o.   MRN: [id number]       CARDIOLOGY CONSULT NOTE  Patient ID: Gerald Mccann MRN: [id number] DOB/AGE: Aug 17, 1961 54 y.o.  Admit date: (Not on file) Primary Physician Donzetta Sprung, MD  Reason for Consultation: chest pain  HPI: The patient is a 54 year old male with a history of obesity and hypertension as well as tobacco abuse who presents for evaluation of chest pain. I have reviewed notes from his primary care physician, Dr. Reuel Boom , dated 04/08/15. Blood pressure at that time was 180/110. It has reportedly been as high as 200/108 in the past.   ECG performed in the office today demonstrates normal sinus rhythm with no ischemic ST segment or T-wave abnormalities, nor any arrhythmias.  Echocardiogram from 07/21/10 demonstrated normal left ventricular systolic function and regional wall motion, EF 60-65%, mild LVH, grade 1 diastolic dysfunction, mild aortic stenosis with a mean gradient of 15 mmHg , and borderline dilated aortic root.  Nuclear stress test dated 06/13/06 demonstrated normal myocardial perfusion with no evidence of ischemia.  He denies exertional chest pain. He said he has left precordial "cramps" which last a few seconds at a time and have been going on for 3 or 4 years. Sometimes they occur at night when he is lying in bed and if he rolls to another side they dissipate. He push mows his lawn and weed eats and does a lot of yard work without any limitations. He had been on a different anti-hypertensive medication but this led to erectile dysfunction and was switched to lisinopril.   He said his blood pressure has been elevated since the age of 27.  Soc: Married. Smokes 1 ppd x 40+ years.  Fam: Father died of MI at 47. Brother died of MI at 33. Mother had first MI in late 18's.   No Known Allergies  Current Outpatient Prescriptions  Medication Sig Dispense Refill  . aspirin 81 MG tablet Take 81 mg by mouth  daily.    Marland Kitchen lisinopril (PRINIVIL,ZESTRIL) 10 MG tablet Take 1 tablet by mouth daily.  2  . methocarbamol (ROBAXIN) 750 MG tablet Take 1 tablet by mouth 2 (two) times daily.  3  . NUCYNTA ER 50 MG TB12 Take 1 tablet by mouth every 12 (twelve) hours.  0  . oxyCODONE-acetaminophen (PERCOCET) 7.5-325 MG per tablet Take 1 tablet by mouth every 8 (eight) hours as needed.  0  . ranitidine (ZANTAC) 150 MG tablet Take 150 mg by mouth 2 (two) times daily.     No current facility-administered medications for this visit.    Past Medical History  Diagnosis Date  . HTN (hypertension)   . Hiatal hernia     Past Surgical History  Procedure Laterality Date  . Right knee surgery      Social History   Social History  . Marital Status: Married    Spouse Name: N/A  . Number of Children: 5  . Years of Education: N/A   Occupational History  . Not on file.   Social History Main Topics  . Smoking status: Current Every Day Smoker -- 1.00 packs/day    Types: Cigarettes    Start date: 09/01/1972  . Smokeless tobacco: Never Used  . Alcohol Use: 0.0 oz/week    0 Standard drinks or equivalent per week     Comment: 1 to 2 beers q month  . Drug Use: No  . Sexual Activity: Not on file  Other Topics Concern  . Not on file   Social History Narrative      Prior to Admission medications   Medication Sig Start Date End Date Taking? Authorizing Provider  aspirin 81 MG tablet Take 81 mg by mouth daily.   Yes Historical Provider, MD  lisinopril (PRINIVIL,ZESTRIL) 10 MG tablet Take 1 tablet by mouth daily. 06/07/15  Yes Historical Provider, MD  methocarbamol (ROBAXIN) 750 MG tablet Take 1 tablet by mouth 2 (two) times daily. 06/02/15  Yes Historical Provider, MD  NUCYNTA ER 50 MG TB12 Take 1 tablet by mouth every 12 (twelve) hours. 06/02/15  Yes Historical Provider, MD  oxyCODONE-acetaminophen (PERCOCET) 7.5-325 MG per tablet Take 1 tablet by mouth every 8 (eight) hours as needed. 06/02/15  Yes Historical  Provider, MD  ranitidine (ZANTAC) 150 MG tablet Take 150 mg by mouth 2 (two) times daily.   Yes Historical Provider, MD     Review of systems complete and found to be negative unless listed above in HPI     Physical exam Blood pressure 178/80, pulse 71, height  (1.854 m), weight 251 lb (113.853 kg). General: NAD Neck: No JVD, no thyromegaly or thyroid nodule.  Lungs: Clear to auscultation bilaterally with normal respiratory effort. CV: Nondisplaced PMI. Regular rate and rhythm, normal S1/S2, no S3/S4, II/VI ejection systolic murmur heard throughout precordium but loudest over RUSB.  No peripheral edema.  No carotid bruit.   Abdomen: Soft, nontender, obese, no distention.  Skin: Intact without lesions or rashes.  Neurologic: Alert and oriented x 3.  Psych: Normal affect. Extremities: No clubbing or cyanosis.  HEENT: Normal.   ECG: Most recent ECG reviewed.  Labs:   Lab Results  Component Value Date   WBC 14.2* 11/22/2010   HGB 16.9 11/22/2010   HCT 46.1 11/22/2010   MCV 89.3 11/22/2010   PLT 274 11/22/2010   No results for input(s): NA, K, CL, CO2, BUN, CREATININE, CALCIUM, PROT, BILITOT, ALKPHOS, ALT, AST, GLUCOSE in the last 168 hours.  Invalid input(s): LABALBU Lab Results  Component Value Date   CKTOTAL 202 11/22/2010   CKMB 2.8 11/22/2010   TROPONINI 0.02        NO INDICATION OF MYOCARDIAL INJURY. 11/22/2010   No results found for: CHOL No results found for: HDL No results found for: LDLCALC No results found for: TRIG No results found for: CHOLHDL No results found for: LDLDIRECT       Studies: No results found.  ASSESSMENT AND PLAN:  1. Chest pain: Atypical for ischemic etiology lasting only seconds. ECG normal. May be due to combination of markedly elevated BP and aortic stenosis. Will hold off on stress testing for now and obtain echocardiogram to evaluate cardiac structure and function , as well as degree of aortic stenosis.  2. Essential HTN:  Markedly elevated. Increase lisinopril to 20 mg daily.  3. Aortic stenosis: Obtain echocardiogram to evaluate severity.  4. Aortic root dilatation: Obtain echocardiogram to evaluate severity. Optimal BP control of prime importance.  5. Tobacco abuse: Cessation counseling provided to both patient and spouse (10 minutes). They appear motivated to quit.  Dispo: f/u 6 weeks.   Signed: Prentice Docker, M.D., F.A.C.C.  06/27/2015, 10:06 AM

## 2015-06-27 NOTE — Patient Instructions (Signed)
Your physician recommends that you schedule a follow-up appointment in: 6 WEEKS WITH DR. Purvis Sheffield IN [facility]  Your physician has recommended you make the following change in your medication:   INCREASE LISINOPRIL 20 MG DAILY  Your physician has requested that you have an echocardiogram. Echocardiography is a painless test that uses sound waves to create images of your heart. It provides your doctor with information about the size and shape of your heart and how well your heart's chambers and valves are working. This procedure takes approximately one hour. There are no restrictions for this procedure.   Thank you for choosing [facility]!!

## 2015-07-03 ENCOUNTER — Ambulatory Visit (INDEPENDENT_AMBULATORY_CARE_PROVIDER_SITE_OTHER): Payer: Medicaid Other

## 2015-07-03 ENCOUNTER — Other Ambulatory Visit: Payer: Self-pay

## 2015-07-03 ENCOUNTER — Telehealth: Payer: Self-pay | Admitting: *Deleted

## 2015-07-03 DIAGNOSIS — I350 Nonrheumatic aortic (valve) stenosis: Secondary | ICD-10-CM | POA: Diagnosis not present

## 2015-07-03 NOTE — Telephone Encounter (Signed)
Notes Recorded by Lesle Chris, LPN on 11/06/1094 at 12:59 PM Patient notified. Copy fwd to pmd. Follow up scheduled for 08/12/2015 with Dr. Purvis Sheffield.

## 2015-07-03 NOTE — Telephone Encounter (Signed)
-----   Message from Laqueta Linden, MD sent at 07/03/2015 11:36 AM EDT ----- Normal pumping function with mild aortic valve narrowing (stable since last echo). Continue BP control.

## 2015-08-12 ENCOUNTER — Ambulatory Visit (INDEPENDENT_AMBULATORY_CARE_PROVIDER_SITE_OTHER): Payer: Medicaid Other | Admitting: Cardiovascular Disease

## 2015-08-12 ENCOUNTER — Encounter: Payer: Self-pay | Admitting: Cardiovascular Disease

## 2015-08-12 VITALS — BP 150/98 | HR 57 | Ht 73.0 in | Wt 253.0 lb

## 2015-08-12 DIAGNOSIS — Z716 Tobacco abuse counseling: Secondary | ICD-10-CM | POA: Diagnosis not present

## 2015-08-12 DIAGNOSIS — I350 Nonrheumatic aortic (valve) stenosis: Secondary | ICD-10-CM

## 2015-08-12 DIAGNOSIS — R079 Chest pain, unspecified: Secondary | ICD-10-CM

## 2015-08-12 DIAGNOSIS — I10 Essential (primary) hypertension: Secondary | ICD-10-CM | POA: Diagnosis not present

## 2015-08-12 DIAGNOSIS — I77810 Thoracic aortic ectasia: Secondary | ICD-10-CM

## 2015-08-12 MED ORDER — VARENICLINE TARTRATE 0.5 MG X 11 & 1 MG X 42 PO MISC: ORAL | Status: DC

## 2015-08-12 MED ORDER — AZILSARTAN MEDOXOMIL 80 MG PO TABS: 1.0000 | ORAL_TABLET | Freq: Every day | ORAL | Status: DC

## 2015-08-12 NOTE — Patient Instructions (Signed)
   Stop Lisinopril.  Begin Edarbi  daily - printed script given today with instructions to fill at [facility].  Chantix starter pack.  Continue all other medications.   Your physician wants you to follow up in: 6 months.  You will receive a reminder letter in the mail one-two months in advance.  If you don't receive a letter, please call our office to schedule the follow up appointment

## 2015-08-12 NOTE — Progress Notes (Signed)
Patient ID: KAILAND SEDA, male   DOB: 04/29/1961, 54 y.o.   MRN: [id number]      SUBJECTIVE: The patient returns for follow-up after undergoing cardiovascular testing performed for the evaluation of non-exertional chest pain.  Echocardiogram on 07/03/15 demonstrated normal left ventricular systolic function and regional wall motion, LVEF 91-63%, grade 1 diastolic dysfunction, functionally bicuspid aortic valve with mild stenosis and trivial regurgitation, with mild aortic root ectasia.  Has not had any recurrence of transient chest pains. His blood pressure has remained elevated at home, as high as 196/90 yesterday. He developed itching with other antihypertensives but does not recall which ones they were. He wants to quit smoking and requests Chantix. He is unable to exercise due to significant degenerative disc disease of the spine.   Review of Systems: As per "subjective", otherwise negative.  No Known Allergies  Current Outpatient Prescriptions  Medication Sig Dispense Refill  . aspirin 81 MG tablet Take 81 mg by mouth daily.    Marland Kitchen lisinopril (PRINIVIL,ZESTRIL) 20 MG tablet Take 1 tablet (20 mg total) by mouth daily. 90 tablet 3  . NUCYNTA ER 50 MG TB12 Take 1 tablet by mouth every 12 (twelve) hours.  0  . oxyCODONE-acetaminophen (PERCOCET) 7.5-325 MG per tablet Take 1 tablet by mouth every 8 (eight) hours as needed.  0  . ranitidine (ZANTAC) 150 MG tablet Take 150 mg by mouth 2 (two) times daily.     No current facility-administered medications for this visit.    Past Medical History  Diagnosis Date  . HTN (hypertension)   . Hiatal hernia     Past Surgical History  Procedure Laterality Date  . Right knee surgery      Social History   Social History  . Marital Status: Married    Spouse Name: N/A  . Number of Children: 5  . Years of Education: N/A   Occupational History  . Not on file.   Social History Main Topics  . Smoking status: Current Every Day Smoker -- 1.00  packs/day    Types: Cigarettes    Start date: 09/01/1972  . Smokeless tobacco: Never Used  . Alcohol Use: 0.0 oz/week    0 Standard drinks or equivalent per week     Comment: 1 to 2 beers q month  . Drug Use: No  . Sexual Activity: Not on file   Other Topics Concern  . Not on file   Social History Narrative     Filed Vitals:   08/12/15 1056  BP: 150/98  Pulse: 57  Height: _0  (1.854 m)  Weight: 253 lb (114.76 kg)  SpO2: 97%    PHYSICAL EXAM General: NAD Neck: No JVD, no thyromegaly or thyroid nodule.  Lungs: Clear to auscultation bilaterally with normal respiratory effort. CV: Nondisplaced PMI. Regular rate and rhythm, normal S1/S2, no S3/S4, II/VI ejection systolic murmur heard throughout precordium but loudest over RUSB. No peripheral edema. No carotid bruit.  Abdomen: Soft, nontender, obese, no distention.  Skin: Intact without lesions or rashes.  Neurologic: Alert and oriented x 3.  Psych: Normal affect. Extremities: No clubbing or cyanosis.  HEENT: Normal.   ECG: Most recent ECG reviewed.      ASSESSMENT AND PLAN: 1. Chest pain: No recurrences. Prior episodes were non-exertional and atypical for ischemic etiology, lasting only seconds. ECG normal. May be due to markedly elevated BP. Will hold off on stress testing and aim to control BP.  2. Essential HTN: Remains markedly elevated. Will switch lisinopril  to Edarbi 80 mg daily. Will request PCP notes to see which antihypertensives provoked an itching reaction.  3. Aortic stenosis: Mild stenosis. Repeat echo in 2 years.  4. Aortic root dilatation: Mild ectasia noted. Optimal BP control of prime importance.  5. Tobacco abuse: Cessation counseling again provided to both patient and spouse (10 minutes). They appeared motivated to quit. Will prescribe Chantix starter kit.  Dispo: f/u 6 months.   Kate Sable, M.D., F.A.C.C.

## 2015-08-13 ENCOUNTER — Encounter: Payer: Self-pay | Admitting: *Deleted

## 2015-08-19 ENCOUNTER — Other Ambulatory Visit: Payer: Self-pay | Admitting: *Deleted

## 2015-08-19 MED ORDER — AZILSARTAN MEDOXOMIL 80 MG PO TABS: 1.0000 | ORAL_TABLET | Freq: Every day | ORAL | Status: DC

## 2015-08-19 MED ORDER — BLOOD PRESSURE MONITOR MISC: Status: AC

## 2015-08-27 ENCOUNTER — Ambulatory Visit (INDEPENDENT_AMBULATORY_CARE_PROVIDER_SITE_OTHER): Payer: Medicaid Other | Admitting: *Deleted

## 2015-08-27 ENCOUNTER — Telehealth: Payer: Self-pay | Admitting: Cardiovascular Disease

## 2015-08-27 VITALS — BP 180/96 | HR 65

## 2015-08-27 DIAGNOSIS — I10 Essential (primary) hypertension: Secondary | ICD-10-CM | POA: Diagnosis not present

## 2015-08-27 NOTE — Telephone Encounter (Signed)
Switch to azilsartan-chlorthalidone 40-25 mg daily. Check BMET 5 days afterwards.

## 2015-08-27 NOTE — Telephone Encounter (Signed)
Patient walk in  Patient was at his Pain Doctor and was advised to come here to have BP checked and his medications looked at due to BP reading 210/104  5 minutes later   186/100

## 2015-08-27 NOTE — Telephone Encounter (Signed)
Patient brought back to room for nurse to check BP.  After sitting approximately 5 minutes, BP was 180/96  65.  Stated he has stopped the Lisinopril & been on the [place] 80mg  daily for about 7-10 days now.  States his back pain level today is 3-4 / 10.  Stated he was looking at getting BP monitor for home use.  I encouraged him to do this so we can better follow his readings.  Message sent to provider for further advice.

## 2015-08-27 NOTE — Telephone Encounter (Signed)
Patient notified.  3 weeks of samples provided.  Will give Edarbycor 40/12.5mg  daily instead of the 40/25mg .  (Dr. Purvis SheffieldKoneswaran aware that this strength is the only one we currently have.) Advised to call after 2 weeks so we can send in prescription to the pharmacy or increase to the 40/25mg  if necessary.  Lab order given today.  He will have done at PMD office.  He will also continue to monitor readings

## 2015-08-28 MED ORDER — AZILSARTAN-CHLORTHALIDONE 40-12.5 MG PO TABS: 1.0000 | ORAL_TABLET | Freq: Every day | ORAL | Status: DC

## 2015-08-28 NOTE — Telephone Encounter (Signed)
Pt came by office and picked up lab orders and samples of Edarbycor 40/12.5 mg

## 2015-09-11 ENCOUNTER — Encounter: Payer: Self-pay | Admitting: *Deleted

## 2015-09-15 ENCOUNTER — Telehealth: Payer: Self-pay | Admitting: Cardiovascular Disease

## 2015-09-15 NOTE — Telephone Encounter (Signed)
Pt has enough edarbycor for today and tomorrow, labs are scanned in. Will forward to Dr. Junius ArgyleKoneswaran's nurse for f/u. Pt aware it would be Tuesday before call back.

## 2015-09-16 ENCOUNTER — Telehealth: Payer: Self-pay | Admitting: Cardiovascular Disease

## 2015-09-16 ENCOUNTER — Ambulatory Visit (INDEPENDENT_AMBULATORY_CARE_PROVIDER_SITE_OTHER): Payer: Medicaid Other | Admitting: *Deleted

## 2015-09-16 VITALS — BP 130/73 | HR 77

## 2015-09-16 DIAGNOSIS — I10 Essential (primary) hypertension: Secondary | ICD-10-CM

## 2015-09-16 MED ORDER — AZILSARTAN-CHLORTHALIDONE 40-12.5 MG PO TABS: 1.0000 | ORAL_TABLET | Freq: Every day | ORAL | Status: DC

## 2015-09-16 NOTE — Progress Notes (Signed)
Patient walked into office this morning wanting BP check.  See phone note dated 08/27/2015.  Patient was changed to [employer] CorporationEdar[employer]cor 40/12.5mg  daily.  Samples were given & patient was told to call the office back with update prior to samples running out in case we had to go up on the dose.  Instead patient came to office.  Stated he has not purchased BP monitor yet due to large arm size.  Explained to to patient that you can always use the forearm, but should be able to find adjustable cuff.    BP checked with large cuff -  137/86  76  BP checked on forearm with regular cuff -  130/73  77   Stated he did have BP checked last week while in MD office with his son - BP was 128/76.    Patient aware that message will be sent to MD to confirm dosing based off of readings above.

## 2015-09-16 NOTE — Telephone Encounter (Signed)
Patient walked into office this morning wanting BP check.  Nurse BP check was done for patient.  (see EPIC)    Reminded patient that he was supposed to call me a few days before samples ran out in case we needed to increase further.  2 more weeks of samples provided till we get the okay from MD about dosing.    Patient notified of stable labs at this time also.    Patient also reminded to call ahead for nurse visit to be scheduled, as we do not take walk-ins.  Patient verbalized understanding.

## 2015-09-16 NOTE — Telephone Encounter (Signed)
Continue with current Edarbyclor dose.

## 2015-09-17 NOTE — Progress Notes (Signed)
Gerald LindenSuresh A Koneswaran, MD at 09/16/2015 9:47 AM    Status: Signed      Expand All Collapse All    Continue with current Edarbyclor dose.

## 2015-09-18 MED ORDER — AZILSARTAN-CHLORTHALIDONE 40-12.5 MG PO TABS: 1.0000 | ORAL_TABLET | Freq: Every day | ORAL | Status: DC

## 2015-09-18 NOTE — Addendum Note (Signed)
Addended by: Lesle ChrisHILL, Hanako Tipping G on: 09/18/2015 08:37 AM   Modules accepted: Orders

## 2015-09-18 NOTE — Telephone Encounter (Signed)
Patient notified.  Will send new rx to [facility] today.

## 2015-09-30 ENCOUNTER — Other Ambulatory Visit: Payer: Self-pay | Admitting: *Deleted

## 2015-09-30 MED ORDER — AZILSARTAN-CHLORTHALIDONE 40-12.5 MG PO TABS: 1.0000 | ORAL_TABLET | Freq: Every day | ORAL | Status: DC

## 2015-10-15 ENCOUNTER — Telehealth: Payer: Self-pay | Admitting: *Deleted

## 2015-10-15 NOTE — Telephone Encounter (Signed)
Start valsartan 160 mg once daily.

## 2015-10-15 NOTE — Telephone Encounter (Signed)
Is BP normal during these episodes? May be related to high BP as I described in my last office note.  If BP has been normal during these times, can obtain an exercise Cardiolite stress test.

## 2015-10-15 NOTE — Telephone Encounter (Signed)
Patient stated he received certified letter in the mail yesterday - Medicaid has denied prior authorization request for the Edarbyclor.  Please advise on change of medication.

## 2015-10-15 NOTE — Telephone Encounter (Signed)
Patient notified.  Will send new medication to pharmacy to [facility] today.  Also, patient c/o chest pain off and on every day.  States last for 10-20 minutes, then eases off.  Breaks out in sweat during these episodes.  Stated these are really no different than when he saw you in the office.  Can occur at any time, mostly when setting down.  Patient notified that message will be sent to provider for further advice.

## 2015-10-16 MED ORDER — VALSARTAN 160 MG PO TABS: 160.0000 mg | ORAL_TABLET | Freq: Every day | ORAL | Status: DC

## 2015-10-16 NOTE — Telephone Encounter (Signed)
Patient notified

## 2015-11-04 ENCOUNTER — Telehealth: Payer: Self-pay | Admitting: *Deleted

## 2015-11-04 NOTE — Telephone Encounter (Signed)
Edarbyclor denied by [employer] insurance.  (Medicaid)  Valsartan 160mg  daily approved today through 10-29-2016.    Patient notified.

## 2015-12-11 DIAGNOSIS — J101 Influenza due to other identified influenza virus with other respiratory manifestations: Secondary | ICD-10-CM | POA: Diagnosis not present

## 2015-12-17 DIAGNOSIS — J189 Pneumonia, unspecified organism: Secondary | ICD-10-CM | POA: Diagnosis not present

## 2015-12-17 DIAGNOSIS — F17210 Nicotine dependence, cigarettes, uncomplicated: Secondary | ICD-10-CM | POA: Diagnosis not present

## 2015-12-29 DIAGNOSIS — S0501XA Injury of conjunctiva and corneal abrasion without foreign body, right eye, initial encounter: Secondary | ICD-10-CM | POA: Diagnosis not present

## 2016-01-05 DIAGNOSIS — J302 Other seasonal allergic rhinitis: Secondary | ICD-10-CM | POA: Diagnosis not present

## 2016-01-05 DIAGNOSIS — H903 Sensorineural hearing loss, bilateral: Secondary | ICD-10-CM | POA: Diagnosis not present

## 2016-01-05 DIAGNOSIS — H9313 Tinnitus, bilateral: Secondary | ICD-10-CM | POA: Diagnosis not present

## 2016-01-07 DIAGNOSIS — J189 Pneumonia, unspecified organism: Secondary | ICD-10-CM | POA: Diagnosis not present

## 2016-02-11 ENCOUNTER — Ambulatory Visit (INDEPENDENT_AMBULATORY_CARE_PROVIDER_SITE_OTHER): Payer: Medicare Other | Admitting: Cardiovascular Disease

## 2016-02-11 ENCOUNTER — Encounter: Payer: Self-pay | Admitting: Cardiovascular Disease

## 2016-02-11 VITALS — BP 160/94 | HR 73 | Ht 73.0 in | Wt 249.0 lb

## 2016-02-11 DIAGNOSIS — I77810 Thoracic aortic ectasia: Secondary | ICD-10-CM

## 2016-02-11 DIAGNOSIS — I10 Essential (primary) hypertension: Secondary | ICD-10-CM | POA: Diagnosis not present

## 2016-02-11 DIAGNOSIS — I350 Nonrheumatic aortic (valve) stenosis: Secondary | ICD-10-CM

## 2016-02-11 MED ORDER — VALSARTAN 320 MG PO TABS: 320.0000 mg | ORAL_TABLET | Freq: Every day | ORAL | Status: DC

## 2016-02-11 NOTE — Progress Notes (Signed)
Patient ID: Gerald Mccann, male   DOB: [birth year], 55 y.o.   MRN: [id number]      SUBJECTIVE: The patient returns for follow-up of aortic stenosis, hypertension, and aortic root dilatation. He has a prior history of non-exertional and atypical chest pain.  Echocardiogram on 07/03/15 demonstrated normal left ventricular systolic function and regional wall motion, LVEF 60-65%, grade 1 diastolic dysfunction, functionally bicuspid aortic valve with mild stenosis and trivial regurgitation, with mild aortic root ectasia.  He developed chest pains within 15 minutes of taking Chantix and stopped it. He is trying to lose weight. He walks 1 mile every evening on the treadmill without exertional dyspnea.   Review of Systems: As per "subjective", otherwise negative.  No Known Allergies  Current Outpatient Prescriptions  Medication Sig Dispense Refill  . aspirin 81 MG tablet Take 81 mg by mouth daily.    . Blood Pressure Monitor MISC Use as directed 1 each 0  . NUCYNTA ER 50 MG TB12 Take 1 tablet by mouth every 12 (twelve) hours.  0  . oxyCODONE-acetaminophen (PERCOCET) 7.5-325 MG per tablet Take 1 tablet by mouth every 8 (eight) hours as needed.  0  . ranitidine (ZANTAC) 150 MG tablet Take 150 mg by mouth 2 (two) times daily.    . valsartan (DIOVAN) 160 MG tablet Take 1 tablet (160 mg total) by mouth daily. 30 tablet 6  . varenicline (CHANTIX STARTING MONTH PAK) 0.5 MG X 11 & 1 MG X 42 tablet Take one 0.5 mg tablet by mouth once daily for 3 days, then increase to one 0.5 mg tablet twice daily for 4 days, then increase to one 1 mg tablet twice daily. 53 tablet 0   No current facility-administered medications for this visit.    Past Medical History  Diagnosis Date  . HTN (hypertension)   . Hiatal hernia     Past Surgical History  Procedure Laterality Date  . Right knee surgery      Social History   Social History  . Marital Status: Married    Spouse Name: N/A  . Number of Children: 5    . Years of Education: N/A   Occupational History  . Not on file.   Social History Main Topics  . Smoking status: Current Every Day Smoker -- 1.00 packs/day    Types: Cigarettes    Start date: 09/01/1972  . Smokeless tobacco: Never Used  . Alcohol Use: 0.0 oz/week    0 Standard drinks or equivalent per week     Comment: 1 to 2 beers q month  . Drug Use: No  . Sexual Activity: Not on file   Other Topics Concern  . Not on file   Social History Narrative     Filed Vitals:   02/11/16 0830  BP: 160/94  Pulse: 73  Height:  (1.854 m)  Weight: 249 lb (112.946 kg)  SpO2: 95%    PHYSICAL EXAM General: NAD Neck: No JVD, no thyromegaly or thyroid nodule.  Lungs: Clear to auscultation bilaterally with normal respiratory effort. CV: Nondisplaced PMI. Regular rate and rhythm, normal S1/S2, no S3/S4, I/VI ejection systolic murmur heard throughout precordium but loudest over RUSB. No peripheral edema.   Abdomen: Soft, nontender, obese, no distention.  Skin: Intact without lesions or rashes.  Neurologic: Alert and oriented x 3.  Psych: Normal affect. Extremities: No clubbing or cyanosis.  HEENT: Normal.   ECG: Most recent ECG reviewed.      ASSESSMENT AND PLAN: 1. Essential HTN:  Remains elevated. Will increase valsartan to 320 mg daily.  2. Aortic stenosis: Mild stenosis. Repeat echo in 1.5 years.  3. Aortic root dilatation: Mild ectasia noted. Optimal BP control of prime importance.  Dispo: fu 1 year.  Prentice DockerSuresh Koneswaran, M.D., F.A.C.C.

## 2016-02-11 NOTE — Patient Instructions (Signed)
Your physician has recommended you make the following change in your medication:  Increase valsartan to 320 mg daily. You may take (2) of your 160 mg tablets daily until they are finished. Continue all other medications the same. Your physician recommends that you schedule a follow-up appointment in: 1 year. You will receive a reminder letter in the mail in about 10 months reminding you to call and schedule your appointment. If you don't receive this letter, please contact our office.

## 2016-02-17 DIAGNOSIS — R739 Hyperglycemia, unspecified: Secondary | ICD-10-CM | POA: Diagnosis not present

## 2016-02-17 DIAGNOSIS — F17210 Nicotine dependence, cigarettes, uncomplicated: Secondary | ICD-10-CM | POA: Diagnosis not present

## 2016-02-17 DIAGNOSIS — I10 Essential (primary) hypertension: Secondary | ICD-10-CM | POA: Diagnosis not present

## 2016-02-17 DIAGNOSIS — R011 Cardiac murmur, unspecified: Secondary | ICD-10-CM | POA: Diagnosis not present

## 2016-02-19 DIAGNOSIS — R011 Cardiac murmur, unspecified: Secondary | ICD-10-CM | POA: Diagnosis not present

## 2016-02-19 DIAGNOSIS — R739 Hyperglycemia, unspecified: Secondary | ICD-10-CM | POA: Diagnosis not present

## 2016-02-19 DIAGNOSIS — F17210 Nicotine dependence, cigarettes, uncomplicated: Secondary | ICD-10-CM | POA: Diagnosis not present

## 2016-02-19 DIAGNOSIS — I10 Essential (primary) hypertension: Secondary | ICD-10-CM | POA: Diagnosis not present

## 2016-03-12 DIAGNOSIS — M25512 Pain in left shoulder: Secondary | ICD-10-CM | POA: Diagnosis not present

## 2016-04-10 DIAGNOSIS — F17210 Nicotine dependence, cigarettes, uncomplicated: Secondary | ICD-10-CM | POA: Diagnosis not present

## 2016-04-10 DIAGNOSIS — M545 Low back pain: Secondary | ICD-10-CM | POA: Diagnosis not present

## 2016-04-10 DIAGNOSIS — K219 Gastro-esophageal reflux disease without esophagitis: Secondary | ICD-10-CM | POA: Diagnosis not present

## 2016-04-10 DIAGNOSIS — J351 Hypertrophy of tonsils: Secondary | ICD-10-CM | POA: Diagnosis not present

## 2016-04-10 DIAGNOSIS — R739 Hyperglycemia, unspecified: Secondary | ICD-10-CM | POA: Diagnosis not present

## 2016-04-10 DIAGNOSIS — I10 Essential (primary) hypertension: Secondary | ICD-10-CM | POA: Diagnosis not present

## 2016-04-26 DIAGNOSIS — G4700 Insomnia, unspecified: Secondary | ICD-10-CM | POA: Diagnosis not present

## 2016-04-28 DIAGNOSIS — I10 Essential (primary) hypertension: Secondary | ICD-10-CM | POA: Diagnosis not present

## 2016-05-07 DIAGNOSIS — J343 Hypertrophy of nasal turbinates: Secondary | ICD-10-CM | POA: Diagnosis not present

## 2016-05-07 DIAGNOSIS — R0683 Snoring: Secondary | ICD-10-CM | POA: Diagnosis not present

## 2016-05-07 DIAGNOSIS — J342 Deviated nasal septum: Secondary | ICD-10-CM | POA: Diagnosis not present

## 2016-05-07 DIAGNOSIS — J351 Hypertrophy of tonsils: Secondary | ICD-10-CM | POA: Diagnosis not present

## 2016-05-20 DIAGNOSIS — I10 Essential (primary) hypertension: Secondary | ICD-10-CM | POA: Diagnosis not present

## 2016-05-20 DIAGNOSIS — L299 Pruritus, unspecified: Secondary | ICD-10-CM | POA: Diagnosis not present

## 2016-05-20 DIAGNOSIS — Z6833 Body mass index (BMI) 33.0-33.9, adult: Secondary | ICD-10-CM | POA: Diagnosis not present

## 2016-06-01 DIAGNOSIS — R739 Hyperglycemia, unspecified: Secondary | ICD-10-CM | POA: Diagnosis not present

## 2016-06-01 DIAGNOSIS — F17210 Nicotine dependence, cigarettes, uncomplicated: Secondary | ICD-10-CM | POA: Diagnosis not present

## 2016-06-01 DIAGNOSIS — Z6833 Body mass index (BMI) 33.0-33.9, adult: Secondary | ICD-10-CM | POA: Diagnosis not present

## 2016-06-01 DIAGNOSIS — R011 Cardiac murmur, unspecified: Secondary | ICD-10-CM | POA: Diagnosis not present

## 2016-06-01 DIAGNOSIS — I10 Essential (primary) hypertension: Secondary | ICD-10-CM | POA: Diagnosis not present

## 2016-06-01 DIAGNOSIS — G4700 Insomnia, unspecified: Secondary | ICD-10-CM | POA: Diagnosis not present

## 2016-06-23 DIAGNOSIS — R0683 Snoring: Secondary | ICD-10-CM | POA: Diagnosis not present

## 2016-06-23 DIAGNOSIS — J302 Other seasonal allergic rhinitis: Secondary | ICD-10-CM | POA: Diagnosis not present

## 2016-06-23 DIAGNOSIS — J343 Hypertrophy of nasal turbinates: Secondary | ICD-10-CM | POA: Diagnosis not present

## 2016-06-23 DIAGNOSIS — J342 Deviated nasal septum: Secondary | ICD-10-CM | POA: Diagnosis not present

## 2016-06-24 DIAGNOSIS — M47814 Spondylosis without myelopathy or radiculopathy, thoracic region: Secondary | ICD-10-CM | POA: Diagnosis not present

## 2016-06-24 DIAGNOSIS — M791 Myalgia: Secondary | ICD-10-CM | POA: Diagnosis not present

## 2016-06-24 DIAGNOSIS — M5134 Other intervertebral disc degeneration, thoracic region: Secondary | ICD-10-CM | POA: Diagnosis not present

## 2016-06-24 DIAGNOSIS — M47816 Spondylosis without myelopathy or radiculopathy, lumbar region: Secondary | ICD-10-CM | POA: Diagnosis not present

## 2016-06-24 DIAGNOSIS — G894 Chronic pain syndrome: Secondary | ICD-10-CM | POA: Diagnosis not present

## 2016-07-21 DIAGNOSIS — M25512 Pain in left shoulder: Secondary | ICD-10-CM | POA: Diagnosis not present

## 2016-08-16 DIAGNOSIS — R079 Chest pain, unspecified: Secondary | ICD-10-CM | POA: Diagnosis not present

## 2016-08-16 DIAGNOSIS — R109 Unspecified abdominal pain: Secondary | ICD-10-CM | POA: Diagnosis not present

## 2016-08-16 DIAGNOSIS — F17210 Nicotine dependence, cigarettes, uncomplicated: Secondary | ICD-10-CM | POA: Diagnosis not present

## 2016-08-16 DIAGNOSIS — R064 Hyperventilation: Secondary | ICD-10-CM | POA: Diagnosis not present

## 2016-08-16 DIAGNOSIS — Z79899 Other long term (current) drug therapy: Secondary | ICD-10-CM | POA: Diagnosis not present

## 2016-08-16 DIAGNOSIS — K219 Gastro-esophageal reflux disease without esophagitis: Secondary | ICD-10-CM | POA: Diagnosis not present

## 2016-08-16 DIAGNOSIS — F458 Other somatoform disorders: Secondary | ICD-10-CM | POA: Diagnosis not present

## 2016-08-16 DIAGNOSIS — I714 Abdominal aortic aneurysm, without rupture: Secondary | ICD-10-CM | POA: Diagnosis not present

## 2016-08-16 DIAGNOSIS — R0602 Shortness of breath: Secondary | ICD-10-CM | POA: Diagnosis not present

## 2016-08-16 DIAGNOSIS — R7303 Prediabetes: Secondary | ICD-10-CM | POA: Diagnosis not present

## 2016-08-16 DIAGNOSIS — I10 Essential (primary) hypertension: Secondary | ICD-10-CM | POA: Diagnosis not present

## 2016-08-16 DIAGNOSIS — E119 Type 2 diabetes mellitus without complications: Secondary | ICD-10-CM | POA: Diagnosis not present

## 2016-08-16 DIAGNOSIS — J441 Chronic obstructive pulmonary disease with (acute) exacerbation: Secondary | ICD-10-CM | POA: Diagnosis not present

## 2016-08-16 DIAGNOSIS — Z5321 Procedure and treatment not carried out due to patient leaving prior to being seen by health care provider: Secondary | ICD-10-CM | POA: Diagnosis not present

## 2016-08-16 DIAGNOSIS — Z720 Tobacco use: Secondary | ICD-10-CM | POA: Diagnosis not present

## 2016-08-20 DIAGNOSIS — F17210 Nicotine dependence, cigarettes, uncomplicated: Secondary | ICD-10-CM | POA: Diagnosis not present

## 2016-08-20 DIAGNOSIS — Z6833 Body mass index (BMI) 33.0-33.9, adult: Secondary | ICD-10-CM | POA: Diagnosis not present

## 2016-08-20 DIAGNOSIS — E782 Mixed hyperlipidemia: Secondary | ICD-10-CM | POA: Diagnosis not present

## 2016-08-20 DIAGNOSIS — I10 Essential (primary) hypertension: Secondary | ICD-10-CM | POA: Diagnosis not present

## 2016-08-20 DIAGNOSIS — R011 Cardiac murmur, unspecified: Secondary | ICD-10-CM | POA: Diagnosis not present

## 2016-08-20 DIAGNOSIS — R739 Hyperglycemia, unspecified: Secondary | ICD-10-CM | POA: Diagnosis not present

## 2016-09-17 ENCOUNTER — Encounter: Payer: Medicare Other | Admitting: Vascular Surgery

## 2016-10-05 DIAGNOSIS — R011 Cardiac murmur, unspecified: Secondary | ICD-10-CM | POA: Diagnosis not present

## 2016-10-05 DIAGNOSIS — E782 Mixed hyperlipidemia: Secondary | ICD-10-CM | POA: Diagnosis not present

## 2016-10-05 DIAGNOSIS — G4700 Insomnia, unspecified: Secondary | ICD-10-CM | POA: Diagnosis not present

## 2016-10-05 DIAGNOSIS — I10 Essential (primary) hypertension: Secondary | ICD-10-CM | POA: Diagnosis not present

## 2016-10-05 DIAGNOSIS — R739 Hyperglycemia, unspecified: Secondary | ICD-10-CM | POA: Diagnosis not present

## 2016-10-05 DIAGNOSIS — F17210 Nicotine dependence, cigarettes, uncomplicated: Secondary | ICD-10-CM | POA: Diagnosis not present

## 2016-11-05 DIAGNOSIS — M25512 Pain in left shoulder: Secondary | ICD-10-CM | POA: Diagnosis not present

## 2016-11-05 DIAGNOSIS — J0101 Acute recurrent maxillary sinusitis: Secondary | ICD-10-CM | POA: Diagnosis not present

## 2017-01-05 DIAGNOSIS — I10 Essential (primary) hypertension: Secondary | ICD-10-CM | POA: Diagnosis not present

## 2017-01-05 DIAGNOSIS — E782 Mixed hyperlipidemia: Secondary | ICD-10-CM | POA: Diagnosis not present

## 2017-01-05 DIAGNOSIS — R011 Cardiac murmur, unspecified: Secondary | ICD-10-CM | POA: Diagnosis not present

## 2017-01-05 DIAGNOSIS — R739 Hyperglycemia, unspecified: Secondary | ICD-10-CM | POA: Diagnosis not present

## 2017-01-05 DIAGNOSIS — M25512 Pain in left shoulder: Secondary | ICD-10-CM | POA: Diagnosis not present

## 2017-01-05 DIAGNOSIS — F17210 Nicotine dependence, cigarettes, uncomplicated: Secondary | ICD-10-CM | POA: Diagnosis not present

## 2017-01-05 DIAGNOSIS — G4700 Insomnia, unspecified: Secondary | ICD-10-CM | POA: Diagnosis not present

## 2017-01-10 DIAGNOSIS — M67912 Unspecified disorder of synovium and tendon, left shoulder: Secondary | ICD-10-CM | POA: Diagnosis not present

## 2017-01-10 DIAGNOSIS — M19012 Primary osteoarthritis, left shoulder: Secondary | ICD-10-CM | POA: Diagnosis not present

## 2017-01-18 DIAGNOSIS — J0101 Acute recurrent maxillary sinusitis: Secondary | ICD-10-CM | POA: Diagnosis not present

## 2017-01-18 DIAGNOSIS — J069 Acute upper respiratory infection, unspecified: Secondary | ICD-10-CM | POA: Diagnosis not present

## 2017-01-18 DIAGNOSIS — Z6833 Body mass index (BMI) 33.0-33.9, adult: Secondary | ICD-10-CM | POA: Diagnosis not present

## 2017-02-07 DIAGNOSIS — M19012 Primary osteoarthritis, left shoulder: Secondary | ICD-10-CM | POA: Diagnosis not present

## 2017-02-14 DIAGNOSIS — M19041 Primary osteoarthritis, right hand: Secondary | ICD-10-CM | POA: Diagnosis not present

## 2017-02-14 DIAGNOSIS — M25542 Pain in joints of left hand: Secondary | ICD-10-CM | POA: Diagnosis not present

## 2017-02-14 DIAGNOSIS — Z681 Body mass index (BMI) 19 or less, adult: Secondary | ICD-10-CM | POA: Diagnosis not present

## 2017-02-14 DIAGNOSIS — M79645 Pain in left finger(s): Secondary | ICD-10-CM | POA: Diagnosis not present

## 2017-02-14 DIAGNOSIS — R2232 Localized swelling, mass and lump, left upper limb: Secondary | ICD-10-CM | POA: Diagnosis not present

## 2017-02-14 DIAGNOSIS — M795 Residual foreign body in soft tissue: Secondary | ICD-10-CM | POA: Diagnosis not present

## 2017-02-14 DIAGNOSIS — M79644 Pain in right finger(s): Secondary | ICD-10-CM | POA: Diagnosis not present

## 2017-02-18 ENCOUNTER — Encounter: Payer: Self-pay | Admitting: *Deleted

## 2017-02-18 ENCOUNTER — Telehealth: Payer: Self-pay | Admitting: Cardiovascular Disease

## 2017-02-18 ENCOUNTER — Ambulatory Visit (INDEPENDENT_AMBULATORY_CARE_PROVIDER_SITE_OTHER): Payer: Medicare Other | Admitting: Cardiovascular Disease

## 2017-02-18 VITALS — BP 171/95 | HR 71 | Ht 73.0 in | Wt 244.0 lb

## 2017-02-18 DIAGNOSIS — Z720 Tobacco use: Secondary | ICD-10-CM | POA: Diagnosis not present

## 2017-02-18 DIAGNOSIS — I350 Nonrheumatic aortic (valve) stenosis: Secondary | ICD-10-CM | POA: Diagnosis not present

## 2017-02-18 DIAGNOSIS — I10 Essential (primary) hypertension: Secondary | ICD-10-CM | POA: Diagnosis not present

## 2017-02-18 DIAGNOSIS — I77810 Thoracic aortic ectasia: Secondary | ICD-10-CM | POA: Diagnosis not present

## 2017-02-18 MED ORDER — AMLODIPINE BESYLATE 10 MG PO TABS: 10.0000 mg | ORAL_TABLET | Freq: Every day | ORAL | 3 refills | Status: DC

## 2017-02-18 NOTE — Patient Instructions (Signed)
Medication Instructions:  Your physician has recommended you make the following change in your medication: INCREASE AMLODIPINE TO 10 MG DAILY Your physician recommends that you continue on all your other medications as directed. Please refer to the Current Medication list given to you today.  Labwork: NONE  Testing/Procedures: Your physician has requested that you have an echocardiogram. Echocardiography is a painless test that uses sound waves to create images of your heart. It provides your doctor with information about the size and shape of your heart and how well your heart's chambers and valves are working. This procedure takes approximately one hour. There are no restrictions for this procedure.   Follow-Up: Your physician wants you to follow-up in: 1 YEAR WITH DR. Purvis Sheffield. You will receive a reminder letter in the mail two months in advance. If you don't receive a letter, please call our office to schedule the follow-up appointment.  Any Other Special Instructions Will Be Listed Below (If Applicable).  If you need a refill on your cardiac medications before your next appointment, please call your pharmacy.

## 2017-02-18 NOTE — Telephone Encounter (Signed)
ECHO Scheduled in [place] on May 10th at 8am

## 2017-02-18 NOTE — Progress Notes (Signed)
SUBJECTIVE: The patient returns for follow-up of aortic stenosis, hypertension, and aortic root dilatation. He has a prior history of nonexertional and atypical chest pain.  Echocardiogram on 07/03/15 demonstrated normal left ventricular systolic function and regional wall motion, LVEF 60-65%, grade 1 diastolic dysfunction, functionally bicuspid aortic valve with mild stenosis and trivial regurgitation, with mild aortic root ectasia.  ECG performed in the office today which I ordered and personally interpreted demonstrates normal sinus rhythm with no ischemic ST segment or T-wave abnormalities, nor any arrhythmias.  He denies exertional chest pain. Smoking 1-1.5 ppd of cigarettes, knows he needs to quit.  BP at home has been 146/72. Drinks vinegar and water to reduce it.  Walks on a treadmill on occasion.  Going to use nicotine patches.    Review of Systems: As per "subjective", otherwise negative.  No Known Allergies  Current Outpatient Prescriptions  Medication Sig Dispense Refill  . amLODipine (NORVASC) 5 MG tablet Take 1 tablet by mouth daily.    Marland Kitchen aspirin 81 MG tablet Take 81 mg by mouth daily.    Marland Kitchen atorvastatin (LIPITOR) 20 MG tablet Take 1 tablet by mouth daily.    . Blood Pressure Monitor MISC Use as directed 1 each 0  . citalopram (CELEXA) 20 MG tablet Take 1 tablet by mouth daily.    . fluticasone (FLONASE) 50 MCG/ACT nasal spray Place 2 sprays into both nostrils daily.    . methocarbamol (ROBAXIN) 750 MG tablet Take 1 tablet by mouth 2 (two) times daily as needed.    Raylene Miyamoto ER 50 MG TB12 Take 1 tablet by mouth every 12 (twelve) hours.  0  . olmesartan (BENICAR) 20 MG tablet Take 1 tablet by mouth daily.    Marland Kitchen omeprazole (PRILOSEC) 40 MG capsule Take 1 capsule by mouth daily.    Marland Kitchen oxyCODONE-acetaminophen (PERCOCET) 7.5-325 MG per tablet Take 1 tablet by mouth every 8 (eight) hours as needed.  0  . ranitidine (ZANTAC) 150 MG tablet Take 150 mg by mouth 2 (two)  times daily.    . valsartan (DIOVAN) 320 MG tablet Take 1 tablet (320 mg total) by mouth daily. 90 tablet 3  . VENTOLIN HFA 108 (90 Base) MCG/ACT inhaler Inhale 2 puffs into the lungs every 4 (four) hours as needed.    . varenicline (CHANTIX STARTING MONTH PAK) 0.5 MG X 11 & 1 MG X 42 tablet Take one 0.5 mg tablet by mouth once daily for 3 days, then increase to one 0.5 mg tablet twice daily for 4 days, then increase to one 1 mg tablet twice daily. (Patient not taking: Reported on 02/18/2017) 53 tablet 0   No current facility-administered medications for this visit.     Past Medical History:  Diagnosis Date  . Hiatal hernia   . HTN (hypertension)     Past Surgical History:  Procedure Laterality Date  . right knee surgery      Social History   Social History  . Marital status: Married    Spouse name: N/A  . Number of children: 5  . Years of education: N/A   Occupational History  . Not on file.   Social History Main Topics  . Smoking status: Current Every Day Smoker    Packs/day: 1.00    Types: Cigarettes    Start date: 09/01/1972  . Smokeless tobacco: Never Used  . Alcohol use 0.0 oz/week     Comment: 1 to 2 beers q month  . Drug use:  No  . Sexual activity: Not on file   Other Topics Concern  . Not on file   Social History Narrative  . No narrative on file     Vitals:   02/18/17 1254  BP: (!) 171/95  Pulse: 71  Weight: 244 lb (110.7 kg)  Height:  (1.854 m)    Wt Readings from Last 3 Encounters:  02/18/17 244 lb (110.7 kg)  02/11/16 249 lb (112.9 kg)  08/12/15 253 lb (114.8 kg)     PHYSICAL EXAM General: NAD HEENT: Normal. Neck: No JVD, no thyromegaly. Lungs: Clear to auscultation bilaterally with normal respiratory effort. CV: Nondisplaced PMI.  Regular rate and rhythm, normal S1/S2, no S3/S4, I/VI ejection systolic murmur heard throughout precordium but loudest over RUSB. No pretibial or periankle edema.  No carotid bruit.   Abdomen: Soft,  nontender, no distention.  Neurologic: Alert and oriented.  Psych: Normal affect. Skin: Normal. Musculoskeletal: No gross deformities.    ECG: Most recent ECG reviewed.   Labs: Lab Results  Component Value Date/Time   K 3.9 11/22/2010 01:16 PM   BUN 14 11/22/2010 01:16 PM   CREATININE 1.05 11/22/2010 01:16 PM   ALT 36 11/22/2010 01:16 PM   HGB 16.9 11/22/2010 01:16 PM     Lipids: No results found for: LDLCALC, LDLDIRECT, CHOL, TRIG, HDL     ASSESSMENT AND PLAN: 1. Essential HTN: Markedly elevated. Will increase amlodipine to 10 mg.  2. Aortic stenosis: Mild stenosis. Will repeat echocardiogram.  3. Aortic root dilatation: Mild ectasia noted. Optimal BP control of prime importance. Will increase amlodipine. Will repeat echocardiogram.  4. Tobacco abuse : Trying to quit. Using nicotine patches.    Disposition: Follow up 1 yr.  Prentice Docker, M.D., F.A.C.C.

## 2017-03-10 ENCOUNTER — Other Ambulatory Visit: Payer: Self-pay

## 2017-03-10 ENCOUNTER — Ambulatory Visit (INDEPENDENT_AMBULATORY_CARE_PROVIDER_SITE_OTHER): Payer: Medicare Other

## 2017-03-10 DIAGNOSIS — I350 Nonrheumatic aortic (valve) stenosis: Secondary | ICD-10-CM | POA: Diagnosis not present

## 2017-03-11 ENCOUNTER — Ambulatory Visit: Payer: Medicare Other | Admitting: Cardiovascular Disease

## 2017-03-14 ENCOUNTER — Telehealth: Payer: Self-pay | Admitting: *Deleted

## 2017-03-14 NOTE — Telephone Encounter (Signed)
Notes recorded by Lesle ChrisHill, Trayven Lumadue G, LPN on 4/09/81195/14/2018 at 3:06 PM EDT Patient notified and verbalized understanding. Copy to pmd.   ------  Notes recorded by Laqueta LindenKoneswaran, Suresh A, MD on 03/10/2017 at 12:06 PM EDT Normal pumping function. Mild to moderate narrowing of aortic valve opening. Will repeat in 1.5-2 yrs.

## 2017-05-05 DIAGNOSIS — I10 Essential (primary) hypertension: Secondary | ICD-10-CM | POA: Diagnosis not present

## 2017-05-05 DIAGNOSIS — G4700 Insomnia, unspecified: Secondary | ICD-10-CM | POA: Diagnosis not present

## 2017-05-05 DIAGNOSIS — M25512 Pain in left shoulder: Secondary | ICD-10-CM | POA: Diagnosis not present

## 2017-05-05 DIAGNOSIS — Z6832 Body mass index (BMI) 32.0-32.9, adult: Secondary | ICD-10-CM | POA: Diagnosis not present

## 2017-05-05 DIAGNOSIS — E782 Mixed hyperlipidemia: Secondary | ICD-10-CM | POA: Diagnosis not present

## 2017-05-05 DIAGNOSIS — R739 Hyperglycemia, unspecified: Secondary | ICD-10-CM | POA: Diagnosis not present

## 2017-05-05 DIAGNOSIS — Z1389 Encounter for screening for other disorder: Secondary | ICD-10-CM | POA: Diagnosis not present

## 2017-05-05 DIAGNOSIS — R011 Cardiac murmur, unspecified: Secondary | ICD-10-CM | POA: Diagnosis not present

## 2017-06-07 DIAGNOSIS — M25512 Pain in left shoulder: Secondary | ICD-10-CM | POA: Diagnosis not present

## 2017-06-07 DIAGNOSIS — I10 Essential (primary) hypertension: Secondary | ICD-10-CM | POA: Diagnosis not present

## 2017-06-07 DIAGNOSIS — E782 Mixed hyperlipidemia: Secondary | ICD-10-CM | POA: Diagnosis not present

## 2017-06-07 DIAGNOSIS — R011 Cardiac murmur, unspecified: Secondary | ICD-10-CM | POA: Diagnosis not present

## 2017-07-05 DIAGNOSIS — J209 Acute bronchitis, unspecified: Secondary | ICD-10-CM | POA: Diagnosis not present

## 2017-07-11 DIAGNOSIS — F17210 Nicotine dependence, cigarettes, uncomplicated: Secondary | ICD-10-CM | POA: Diagnosis not present

## 2017-07-11 DIAGNOSIS — J209 Acute bronchitis, unspecified: Secondary | ICD-10-CM | POA: Diagnosis not present

## 2017-07-11 DIAGNOSIS — Z6830 Body mass index (BMI) 30.0-30.9, adult: Secondary | ICD-10-CM | POA: Diagnosis not present

## 2017-07-26 DIAGNOSIS — J189 Pneumonia, unspecified organism: Secondary | ICD-10-CM | POA: Diagnosis not present

## 2017-07-26 DIAGNOSIS — F17210 Nicotine dependence, cigarettes, uncomplicated: Secondary | ICD-10-CM | POA: Diagnosis not present

## 2017-09-12 DIAGNOSIS — M19012 Primary osteoarthritis, left shoulder: Secondary | ICD-10-CM | POA: Diagnosis not present

## 2017-09-12 DIAGNOSIS — M67912 Unspecified disorder of synovium and tendon, left shoulder: Secondary | ICD-10-CM | POA: Diagnosis not present

## 2017-09-19 DIAGNOSIS — J189 Pneumonia, unspecified organism: Secondary | ICD-10-CM | POA: Diagnosis not present

## 2017-09-19 DIAGNOSIS — F17210 Nicotine dependence, cigarettes, uncomplicated: Secondary | ICD-10-CM | POA: Diagnosis not present

## 2017-09-27 DIAGNOSIS — F17210 Nicotine dependence, cigarettes, uncomplicated: Secondary | ICD-10-CM | POA: Diagnosis not present

## 2017-09-27 DIAGNOSIS — J209 Acute bronchitis, unspecified: Secondary | ICD-10-CM | POA: Diagnosis not present

## 2017-10-14 DIAGNOSIS — F419 Anxiety disorder, unspecified: Secondary | ICD-10-CM | POA: Diagnosis not present

## 2017-10-14 DIAGNOSIS — R011 Cardiac murmur, unspecified: Secondary | ICD-10-CM | POA: Diagnosis not present

## 2017-10-14 DIAGNOSIS — E782 Mixed hyperlipidemia: Secondary | ICD-10-CM | POA: Diagnosis not present

## 2017-10-14 DIAGNOSIS — M25512 Pain in left shoulder: Secondary | ICD-10-CM | POA: Diagnosis not present

## 2017-10-14 DIAGNOSIS — I10 Essential (primary) hypertension: Secondary | ICD-10-CM | POA: Diagnosis not present

## 2017-10-14 DIAGNOSIS — G4700 Insomnia, unspecified: Secondary | ICD-10-CM | POA: Diagnosis not present

## 2017-10-14 DIAGNOSIS — R739 Hyperglycemia, unspecified: Secondary | ICD-10-CM | POA: Diagnosis not present

## 2017-10-14 DIAGNOSIS — J209 Acute bronchitis, unspecified: Secondary | ICD-10-CM | POA: Diagnosis not present

## 2017-11-04 DIAGNOSIS — R739 Hyperglycemia, unspecified: Secondary | ICD-10-CM | POA: Diagnosis not present

## 2017-11-04 DIAGNOSIS — G4700 Insomnia, unspecified: Secondary | ICD-10-CM | POA: Diagnosis not present

## 2017-11-04 DIAGNOSIS — J209 Acute bronchitis, unspecified: Secondary | ICD-10-CM | POA: Diagnosis not present

## 2017-11-04 DIAGNOSIS — I10 Essential (primary) hypertension: Secondary | ICD-10-CM | POA: Diagnosis not present

## 2017-11-04 DIAGNOSIS — M25512 Pain in left shoulder: Secondary | ICD-10-CM | POA: Diagnosis not present

## 2017-11-04 DIAGNOSIS — Z6832 Body mass index (BMI) 32.0-32.9, adult: Secondary | ICD-10-CM | POA: Diagnosis not present

## 2017-11-04 DIAGNOSIS — F419 Anxiety disorder, unspecified: Secondary | ICD-10-CM | POA: Diagnosis not present

## 2017-11-04 DIAGNOSIS — E782 Mixed hyperlipidemia: Secondary | ICD-10-CM | POA: Diagnosis not present

## 2017-11-04 DIAGNOSIS — R011 Cardiac murmur, unspecified: Secondary | ICD-10-CM | POA: Diagnosis not present

## 2017-11-29 DIAGNOSIS — M25512 Pain in left shoulder: Secondary | ICD-10-CM | POA: Diagnosis not present

## 2017-11-29 DIAGNOSIS — M19012 Primary osteoarthritis, left shoulder: Secondary | ICD-10-CM | POA: Diagnosis not present

## 2018-01-17 DIAGNOSIS — Z6832 Body mass index (BMI) 32.0-32.9, adult: Secondary | ICD-10-CM | POA: Diagnosis not present

## 2018-01-17 DIAGNOSIS — R739 Hyperglycemia, unspecified: Secondary | ICD-10-CM | POA: Diagnosis not present

## 2018-01-17 DIAGNOSIS — E782 Mixed hyperlipidemia: Secondary | ICD-10-CM | POA: Diagnosis not present

## 2018-01-17 DIAGNOSIS — M25512 Pain in left shoulder: Secondary | ICD-10-CM | POA: Diagnosis not present

## 2018-01-17 DIAGNOSIS — F419 Anxiety disorder, unspecified: Secondary | ICD-10-CM | POA: Diagnosis not present

## 2018-01-17 DIAGNOSIS — R011 Cardiac murmur, unspecified: Secondary | ICD-10-CM | POA: Diagnosis not present

## 2018-01-17 DIAGNOSIS — G4700 Insomnia, unspecified: Secondary | ICD-10-CM | POA: Diagnosis not present

## 2018-01-17 DIAGNOSIS — I10 Essential (primary) hypertension: Secondary | ICD-10-CM | POA: Diagnosis not present

## 2018-01-30 DIAGNOSIS — M19012 Primary osteoarthritis, left shoulder: Secondary | ICD-10-CM | POA: Diagnosis not present

## 2018-01-30 DIAGNOSIS — S46012D Strain of muscle(s) and tendon(s) of the rotator cuff of left shoulder, subsequent encounter: Secondary | ICD-10-CM | POA: Diagnosis not present

## 2018-01-31 DIAGNOSIS — M19012 Primary osteoarthritis, left shoulder: Secondary | ICD-10-CM | POA: Diagnosis not present

## 2018-01-31 DIAGNOSIS — S46012D Strain of muscle(s) and tendon(s) of the rotator cuff of left shoulder, subsequent encounter: Secondary | ICD-10-CM | POA: Diagnosis not present

## 2018-02-02 ENCOUNTER — Other Ambulatory Visit: Payer: Self-pay | Admitting: Orthopedic Surgery

## 2018-02-02 DIAGNOSIS — M25512 Pain in left shoulder: Secondary | ICD-10-CM

## 2018-02-05 ENCOUNTER — Other Ambulatory Visit: Payer: Medicare Other

## 2018-02-06 ENCOUNTER — Other Ambulatory Visit: Payer: Medicare Other

## 2018-02-28 ENCOUNTER — Ambulatory Visit
Admission: RE | Admit: 2018-02-28 | Discharge: 2018-02-28 | Disposition: A | Discharge status: Home / Self Care | Payer: Medicare Other | Source: Ambulatory Visit | Attending: Orthopedic Surgery | Admitting: Orthopedic Surgery

## 2018-02-28 DIAGNOSIS — M75112 Incomplete rotator cuff tear or rupture of left shoulder, not specified as traumatic: Secondary | ICD-10-CM | POA: Diagnosis not present

## 2018-02-28 DIAGNOSIS — M25512 Pain in left shoulder: Secondary | ICD-10-CM

## 2018-03-20 ENCOUNTER — Other Ambulatory Visit: Payer: Self-pay | Admitting: Cardiovascular Disease

## 2018-03-20 DIAGNOSIS — Z6832 Body mass index (BMI) 32.0-32.9, adult: Secondary | ICD-10-CM | POA: Diagnosis not present

## 2018-03-20 DIAGNOSIS — K1121 Acute sialoadenitis: Secondary | ICD-10-CM | POA: Diagnosis not present

## 2018-03-22 ENCOUNTER — Telehealth: Payer: Self-pay | Admitting: Cardiovascular Disease

## 2018-03-22 NOTE — Telephone Encounter (Signed)
Patient called stating that he is scheduled for shoulder surgery on 04/05/2018 with Dr. Thurston Hole. Patient was told to contact Dr. Purvis Sheffield for surgical clearance.

## 2018-03-22 NOTE — Telephone Encounter (Signed)
LMTCB

## 2018-03-22 NOTE — Telephone Encounter (Signed)
Advised patient he was past due for a follow up. Apt scheduled for 6/10 with lenze in [place]. Patient verbalized understanding.

## 2018-04-01 DIAGNOSIS — K1121 Acute sialoadenitis: Secondary | ICD-10-CM | POA: Diagnosis not present

## 2018-04-04 ENCOUNTER — Ambulatory Visit: Payer: Medicare Other | Admitting: Cardiovascular Disease

## 2018-04-07 DIAGNOSIS — K1121 Acute sialoadenitis: Secondary | ICD-10-CM | POA: Diagnosis not present

## 2018-04-07 DIAGNOSIS — Z6833 Body mass index (BMI) 33.0-33.9, adult: Secondary | ICD-10-CM | POA: Diagnosis not present

## 2018-04-10 ENCOUNTER — Encounter: Payer: Self-pay | Admitting: *Deleted

## 2018-04-10 ENCOUNTER — Encounter

## 2018-04-10 ENCOUNTER — Ambulatory Visit (INDEPENDENT_AMBULATORY_CARE_PROVIDER_SITE_OTHER): Payer: Medicare Other | Admitting: Physician Assistant

## 2018-04-10 VITALS — BP 140/88 | HR 74 | Ht 73.0 in | Wt 245.0 lb

## 2018-04-10 DIAGNOSIS — R0989 Other specified symptoms and signs involving the circulatory and respiratory systems: Secondary | ICD-10-CM | POA: Diagnosis not present

## 2018-04-10 DIAGNOSIS — E6609 Other obesity due to excess calories: Secondary | ICD-10-CM

## 2018-04-10 DIAGNOSIS — I10 Essential (primary) hypertension: Secondary | ICD-10-CM

## 2018-04-10 DIAGNOSIS — I77810 Thoracic aortic ectasia: Secondary | ICD-10-CM

## 2018-04-10 DIAGNOSIS — Z6830 Body mass index (BMI) 30.0-30.9, adult: Secondary | ICD-10-CM

## 2018-04-10 DIAGNOSIS — I350 Nonrheumatic aortic (valve) stenosis: Secondary | ICD-10-CM | POA: Insufficient documentation

## 2018-04-10 DIAGNOSIS — Z01818 Encounter for other preprocedural examination: Secondary | ICD-10-CM | POA: Diagnosis not present

## 2018-04-10 DIAGNOSIS — F17200 Nicotine dependence, unspecified, uncomplicated: Secondary | ICD-10-CM | POA: Diagnosis not present

## 2018-04-10 NOTE — Patient Instructions (Signed)
Medication Instructions:  Your physician recommends that you continue on your current medications as directed. Please refer to the Current Medication list given to you today.   Labwork: NONE   Testing/Procedures: Your physician has requested that you have a carotid duplex. This test is an ultrasound of the carotid arteries in your neck. It looks at blood flow through these arteries that supply the brain with blood. Allow one hour for this exam. There are no restrictions or special instructions.  Your physician has requested that you have an echocardiogram in May of 2020. Echocardiography is a painless test that uses sound waves to create images of your heart. It provides your doctor with information about the size and shape of your heart and how well your heart's chambers and valves are working. This procedure takes approximately one hour. There are no restrictions for this procedure.    Follow-Up: Your physician wants you to follow-up in: May with Dr. Reggy Eye will receive a reminder letter in the mail two months in advance. If you don't receive a letter, please call our office to schedule the follow-up appointment.   Any Other Special Instructions Will Be Listed Below (If Applicable).     If you need a refill on your cardiac medications before your next appointment, please call your pharmacy. Thank you for choosing [facility]!    Low-Sodium Eating Plan Sodium, which is an element that makes up salt, helps you maintain a healthy balance of fluids in your body. Too much sodium can increase your blood pressure and cause fluid and waste to be held in your body. Your health care provider or dietitian may recommend following this plan if you have high blood pressure (hypertension), kidney disease, liver disease, or heart failure. Eating less sodium can help lower your blood pressure, reduce swelling, and protect your heart, liver, and kidneys. What are tips for following  this plan? General guidelines  Most people on this plan should limit their sodium intake to 1,500-2,000 mg (milligrams) of sodium each day. Reading food labels  The Nutrition Facts label lists the amount of sodium in one serving of the food. If you eat more than one serving, you must multiply the listed amount of sodium by the number of servings.  Choose foods with less than 140 mg of sodium per serving.  Avoid foods with 300 mg of sodium or more per serving. Shopping  Look for lower-sodium products, often labeled as "low-sodium" or "no salt added."  Always check the sodium content even if foods are labeled as "unsalted" or "no salt added".  Buy fresh foods. ? Avoid canned foods and premade or frozen meals. ? Avoid canned, cured, or processed meats  Buy breads that have less than 80 mg of sodium per slice. Cooking  Eat more home-cooked food and less restaurant, buffet, and fast food.  Avoid adding salt when cooking. Use salt-free seasonings or herbs instead of table salt or sea salt. Check with your health care provider or pharmacist before using salt substitutes.  Cook with plant-based oils, such as canola, sunflower, or olive oil. Meal planning  When eating at a restaurant, ask that your food be prepared with less salt or no salt, if possible.  Avoid foods that contain MSG (monosodium glutamate). MSG is sometimes added to [place] food, bouillon, and some canned foods. What foods are recommended? The items listed may not be a complete list. Talk with your dietitian about what dietary choices are best for you. Grains Low-sodium  cereals, including oats, puffed wheat and rice, and shredded wheat. Low-sodium crackers. Unsalted rice. Unsalted pasta. Low-sodium bread. Whole-grain breads and whole-grain pasta. Vegetables Fresh or frozen vegetables. "No salt added" canned vegetables. "No salt added" tomato sauce and paste. Low-sodium or reduced-sodium tomato and vegetable  juice. Fruits Fresh, frozen, or canned fruit. Fruit juice. Meats and other protein foods Fresh or frozen (no salt added) meat, poultry, seafood, and fish. Low-sodium canned tuna and salmon. Unsalted nuts. Dried peas, beans, and lentils without added salt. Unsalted canned beans. Eggs. Unsalted nut butters. Dairy Milk. Soy milk. Cheese that is naturally low in sodium, such as ricotta cheese, fresh mozzarella, or Swiss cheese Low-sodium or reduced-sodium cheese. Cream cheese. Yogurt. Fats and oils Unsalted butter. Unsalted margarine with no trans fat. Vegetable oils such as canola or olive oils. Seasonings and other foods Fresh and dried herbs and spices. Salt-free seasonings. Low-sodium mustard and ketchup. Sodium-free salad dressing. Sodium-free light mayonnaise. Fresh or refrigerated horseradish. Lemon juice. Vinegar. Homemade, reduced-sodium, or low-sodium soups. Unsalted popcorn and pretzels. Low-salt or salt-free chips. What foods are not recommended? The items listed may not be a complete list. Talk with your dietitian about what dietary choices are best for you. Grains Instant hot cereals. Bread stuffing, pancake, and biscuit mixes. Croutons. Seasoned rice or pasta mixes. Noodle soup cups. Boxed or frozen macaroni and cheese. Regular salted crackers. Self-rising flour. Vegetables Sauerkraut, pickled vegetables, and relishes. Olives. [place] fries. Onion rings. Regular canned vegetables (not low-sodium or reduced-sodium). Regular canned tomato sauce and paste (not low-sodium or reduced-sodium). Regular tomato and vegetable juice (not low-sodium or reduced-sodium). Frozen vegetables in sauces. Meats and other protein foods Meat or fish that is salted, canned, smoked, spiced, or pickled. Bacon, ham, sausage, hotdogs, corned beef, chipped beef, packaged lunch meats, salt pork, jerky, pickled herring, anchovies, regular canned tuna, sardines, salted nuts. Dairy Processed cheese and cheese spreads.  Cheese curds. Blue cheese. Feta cheese. String cheese. Regular cottage cheese. Buttermilk. Canned milk. Fats and oils Salted butter. Regular margarine. Ghee. Bacon fat. Seasonings and other foods Onion salt, garlic salt, seasoned salt, table salt, and sea salt. Canned and packaged gravies. Worcestershire sauce. Tartar sauce. Barbecue sauce. Teriyaki sauce. Soy sauce, including reduced-sodium. Steak sauce. Fish sauce. Oyster sauce. Cocktail sauce. Horseradish that you find on the shelf. Regular ketchup and mustard. Meat flavorings and tenderizers. Bouillon cubes. Hot sauce and Tabasco sauce. Premade or packaged marinades. Premade or packaged taco seasonings. Relishes. Regular salad dressings. Salsa. Potato and tortilla chips. Corn chips and puffs. Salted popcorn and pretzels. Canned or dried soups. Pizza. Frozen entrees and pot pies. Summary  Eating less sodium can help lower your blood pressure, reduce swelling, and protect your heart, liver, and kidneys.  Most people on this plan should limit their sodium intake to 1,500-2,000 mg (milligrams) of sodium each day.  Canned, boxed, and frozen foods are high in sodium. Restaurant foods, fast foods, and pizza are also very high in sodium. You also get sodium by adding salt to food.  Try to cook at home, eat more fresh fruits and vegetables, and eat less fast food, canned, processed, or prepared foods. This information is not intended to replace advice given to you by your health care provider. Make sure you discuss any questions you have with your health care provider. Document Released: 04/09/2002 Document Revised: 10/11/2016 Document Reviewed: 10/11/2016 Elsevier Interactive Patient Education  2018 [employer].   Smoking Tobacco Information Smoking tobacco will very likely harm your health. Tobacco contains a  poisonous (toxic), colorless chemical called nicotine. Nicotine affects the brain and makes tobacco addictive. This change in your brain can  make it hard to stop smoking. Tobacco also has other toxic chemicals that can hurt your body and raise your risk of many cancers. How can smoking tobacco affect me? Smoking tobacco can increase your chances of having serious health conditions, such as:  Cancer. Smoking is most commonly associated with lung cancer, but can lead to cancer in other parts of the body.  Chronic obstructive pulmonary disease (COPD). This is a long-term lung condition that makes it hard to breathe. It also gets worse over time.  High blood pressure (hypertension), heart disease, stroke, or heart attack.  Lung infections, such as pneumonia.  Cataracts. This is when the lenses in the eyes become clouded.  Digestive problems. This may include peptic ulcers, heartburn, and gastroesophageal reflux disease (GERD).  Oral health problems, such as gum disease and tooth loss.  Loss of taste and smell.  Smoking can affect your appearance by causing:  Wrinkles.  Yellow or stained teeth, fingers, and fingernails.  Smoking tobacco can also affect your social life.  Many workplaces, [employer], hotels, and public places are tobacco-free. This means that you may experience challenges in finding places to smoke when away from home.  The cost of a smoking habit can be expensive. Expenses for someone who smokes come in two ways: ? You spend money on a regular basis to buy tobacco. ? Your health care costs in the long-term are higher if you smoke.  Tobacco smoke can also affect the health of those around you. Children of smokers have greater chances of: ? Sudden infant death syndrome (SIDS). ? Ear infections. ? Lung infections.  What lifestyle changes can be made?  Do not start smoking. Quit if you already do.  To quit smoking: ? Make a plan to quit smoking and commit yourself to it. Look for programs to help you and ask your health care provider for recommendations and ideas. ? Talk with your health care provider  about using nicotine replacement medicines to help you quit. Medicine replacement medicines include gum, lozenges, patches, sprays, or pills. ? Do not replace cigarette smoking with electronic cigarettes, which are commonly called e-cigarettes. The safety of e-cigarettes is not known, and some may contain harmful chemicals. ? Avoid places, people, or situations that tempt you to smoke. ? If you try to quit but return to smoking, don't give up hope. It is very common for people to try a number of times before they fully succeed. When you feel ready again, give it another try.  Quitting smoking might affect the way you eat as well as your weight. Be prepared to monitor your eating habits. Get support in planning and following a healthy diet.  Ask your health care provider about having regular tests (screenings) to check for cancer. This may include blood tests, imaging tests, and other tests.  Exercise regularly. Consider taking walks, joining a gym, or doing yoga or exercise classes.  Develop skills to manage your stress. These skills include meditation. What are the benefits of quitting smoking? By quitting smoking, you may:  Lower your risk of getting cancer and other diseases caused by smoking.  Live longer.  Breathe better.  Lower your blood pressure and heart rate.  Stop your addiction to tobacco.  Stop creating secondhand smoke that hurts other people.  Improve your sense of taste and smell.  Look better over time, due to having  fewer wrinkles and less staining.  What can happen if changes are not made? If you do not stop smoking, you may:  Get cancer and other diseases.  Develop COPD or other long-term (chronic) lung conditions.  Develop serious problems with your heart and blood vessels (cardiovascular system).  Need more tests to screen for problems caused by smoking.  Have higher, long-term healthcare costs from medicines or treatments related to  smoking.  Continue to have worsening changes in your lungs, mouth, and nose.  Where to find support: To get support to quit smoking, consider:  Asking your health care provider for more information and resources.  Taking classes to learn more about quitting smoking.  Looking for local organizations that offer resources about quitting smoking.  Joining a support group for people who want to quit smoking in your local community.  Where to find more information: You may find more information about quitting smoking from:  HelpGuide.org: www.helpguide.org/articles/addictions/how-to-quit-smoking.htm  [employer]: smokefree.gov  American Lung Association: www.lung.org  Contact a health care provider if:  You have problems breathing.  Your lips, nose, or fingers turn blue.  You have chest pain.  You are coughing up blood.  You feel faint or you pass out.  You have other noticeable changes that cause you to worry. Summary  Smoking tobacco can negatively affect your health, the health of those around you, your finances, and your social life.  Do not start smoking. Quit if you already do. If you need help quitting, ask your health care provider.  Think about joining a support group for people who want to quit smoking in your local community. There are many effective programs that will help you to quit this behavior. This information is not intended to replace advice given to you by your health care provider. Make sure you discuss any questions you have with your health care provider. Document Released: 11/02/2016 Document Revised: 11/02/2016 Document Reviewed: 11/02/2016 Elsevier Interactive Patient Education  [employer].

## 2018-04-10 NOTE — Progress Notes (Signed)
Cardiology Office Note    Date:  04/10/2018   ID:  Gerald Mccann, DOB Mar 13, 1961, MRN [id number]  PCP:  Lovey Newcomer, PA  Cardiologist: Prentice Docker, MD  Chief Complaint  Patient presents with  . Pre-op Exam    History of Present Illness:  Gerald Mccann is a 57 y.o. male with history of mild to moderate aortic stenosis, hypertension, aortic root dilatation, history of atypical chest pain.  Patient last saw Dr. Purvis Sheffield 02/18/2017 at which time his blood pressure was elevated and amlodipine was increased to 10 mg once daily.  Tobacco cessation was recommended.  Last 2D echo 03/2017 LVEF 60 to 65% no wall motion abnormalities, mild to moderate aortic stenosis with a mean gradient of 17 mmHg V-max 1.22 cm.  Valve area mean 1.21 cm.  Mild aortic root proximal ascending aortic dilatation 37 mm.  Last nuclear stress test 2007 was without ischemia.  Patient says he has had 2 other since then but we do not have them on file.  Patient comes in today accompanied by his wife for cardiac clearance for shoulder surgery. Hasn't exercised in about 6 months and gained about 20 lbs. Walks a lot at [employer]. Disabled from [employer] back. Smokes 1-1 1/2 pk/day.  Says he cannot quit because he will gain weight.  Has chronic dyspnea on exertion from COPD that is unchanged.  Denies chest pain, palpitations, dizziness or presyncope.  His father died in his 41s of an MI and 2 brothers had MIs in their 27s.  They were also smokers and one with diabetes.  Blood pressure up today because he was sent to the wrong office.  Blood pressures been stable at his PCP according to the patient. also has a recent infection in his jaw on antibiotics from PCP.    Past Medical History:  Diagnosis Date  . Hiatal hernia   . HTN (hypertension)     Past Surgical History:  Procedure Laterality Date  . right knee surgery      Current Medications: No outpatient medications have been marked as taking for the 04/10/18  encounter (Office Visit) with Dyann Kief, PA-C.     Allergies:   Patient has no known allergies.   Social History   Socioeconomic History  . Marital status: Married    Spouse name: Not on file  . Number of children: 5  . Years of education: Not on file  . Highest education level: Not on file  Occupational History  . Not on file  Social Needs  . Financial resource strain: Not on file  . Food insecurity:    Worry: Not on file    Inability: Not on file  . Transportation needs:    Medical: Not on file    Non-medical: Not on file  Tobacco Use  . Smoking status: Current Every Day Smoker    Packs/day: 1.50    Types: Cigarettes    Start date: 09/01/1972  . Smokeless tobacco: Never Used  Substance and Sexual Activity  . Alcohol use: Yes    Alcohol/week: 0.0 oz    Comment: 1 to 2 beers q month  . Drug use: No  . Sexual activity: Not on file  Lifestyle  . Physical activity:    Days per week: Not on file    Minutes per session: Not on file  . Stress: Not on file  Relationships  . Social connections:    Talks on phone: Not on file  Gets together: Not on file    Attends religious service: Not on file    Active member of club or organization: Not on file    Attends meetings of clubs or organizations: Not on file    Relationship status: Not on file  Other Topics Concern  . Not on file  Social History Narrative  . Not on file     Family History:  The patient's family history includes Asthma in his unknown relative; Coronary artery disease in his father and mother; Heart attack in his father; Heart attack (age of onset: 65) in his brother.   ROS:   Please see the history of present illness.    Review of Systems  Constitution: Positive for weight gain.  HENT: Negative.   Cardiovascular: Positive for dyspnea on exertion.  Respiratory: Negative.   Endocrine: Negative.   Hematologic/Lymphatic: Negative.   Musculoskeletal: Positive for joint pain.  Gastrointestinal:  Negative.   Genitourinary: Negative.   Neurological: Negative.    All other systems reviewed and are negative.   PHYSICAL EXAM:   VS:  BP 140/88   Pulse 74   Ht 6\' 1"  (1.854 m)   Wt 245 lb (111.1 kg)   SpO2 97%   BMI 32.32 kg/m   Physical Exam  GEN: Obese, in no acute distress  Neck: Bilateral carotid bruits versus murmur portrayed, no JVD, or masses Cardiac:RRR; 2/6 systolic murmur at the left sternal border and right sternal border Respiratory: Decreased breath sounds but clear to auscultation bilaterally, normal work of breathing GI: soft, nontender, nondistended, + BS Ext: without cyanosis, clubbing, or edema, Good distal pulses bilaterally Neuro:  Alert and Oriented x 3 Psych: euthymic mood, full affect  Wt Readings from Last 3 Encounters:  04/10/18 245 lb (111.1 kg)  02/18/17 244 lb (110.7 kg)  02/11/16 249 lb (112.9 kg)      Studies/Labs Reviewed:   EKG:  EKG is  ordered today.  The ekg ordered today demonstrates normal sinus rhythm, and no acute change  Recent Labs: No results found for requested labs within last 8760 hours.   Lipid Panel No results found for: CHOL, TRIG, HDL, CHOLHDL, VLDL, LDLCALC, LDLDIRECT  Additional studies/ records that were reviewed today include:  2D echo 5/2018Study Conclusions   - Left ventricle: The cavity size was normal. Wall thickness was   increased in a pattern of moderate LVH. Systolic function was   normal. The estimated ejection fraction was in the range of 60%   to 65%. Wall motion was normal; there were no regional wall   motion abnormalities. There was a borderline abnormality in the   ratio of early to atrial left ventricular filling. - Aortic valve: Moderately calcified annulus. Moderately calcified   leaflets. There was mild to moderate stenosis. There was trivial   regurgitation. Peak velocity (S): 268 cm/s. Mean gradient (S): 17   mm Hg. Valve area (VTI): 1.28 cm^2. Valve area (Vmax): 1.22 cm^2.   Valve area  (Vmean): 1.21 cm^2. - Aorta: Mild aortic root and proximal ascending aortic dilatation.   Aortic root dimension: 37 mm (ED). Ascending aortic diameter: 37   mm (S).       ASSESSMENT:    1. Preoperative clearance   2. Aortic valve stenosis, etiology of cardiac valve disease unspecified   3. Aortic root dilatation (HCC)   4. Essential hypertension, benign   5. TOBACCO USER   6. Bilateral carotid bruits   7. Class 1 obesity due to excess calories with  serious comorbidity and body mass index (BMI) of 30.0 to 30.9 in adult      PLAN:  In order of problems listed above:  Preoperative clearance before undergoing left shoulder arthroscopy with possible rotator cuff repair.  Patient with multiple risk factors for CAD and mild to moderate aortic stenosis on 2D echo approximately 1 year ago.  Asymptomatic from both.  Chronic dyspnea on exertion from COPD.  Remote nuclear stress test were normal.  Last one we have on file was 2007.   According to the revised cardiac risk index patient does not need further work-up for undergoing shoulder surgery.  His METs is 6.27. According to the Revised Cardiac Risk Index (RCRI), his Perioperative Risk of Major Cardiac Event is (%): 0.4  His Functional Capacity in METs is: 6.27 according to the Duke Activity Status Index (DASI).    Mild to moderate aortic stenosis on 2D echo 03/2017-we will repeat 2D echo next May with follow-up with Dr. Purvis Sheffield.  Aortic root dilatation 37 mmHg on last echo repeat echo in 1 year.  Essential hypertension blood pressure up initially when he got here because he was upset about going to the wrong office.  He has been checked regularly at PCP and it has been about 130/80.  I rechecked and it was 140/88.  Tobacco abuse long discussion about smoking cessation.  Patient says he will try to use the nicotine patch prior to shoulder surgery.  He has Chantix at home as well.  Has not been using either.  Bilateral carotid bruits-  check carotid Dopplers.  Obesity patient says she is going to start walking on his treadmill beginning today and cut back on when he has been eating.  Medication Adjustments/Labs and Tests Ordered: Current medicines are reviewed at length with the patient today.  Concerns regarding medicines are outlined above.  Medication changes, Labs and Tests ordered today are listed in the Patient Instructions below. Patient Instructions  Medication Instructions:  Your physician recommends that you continue on your current medications as directed. Please refer to the Current Medication list given to you today.   Labwork: NONE   Testing/Procedures: Your physician has requested that you have a carotid duplex. This test is an ultrasound of the carotid arteries in your neck. It looks at blood flow through these arteries that supply the brain with blood. Allow one hour for this exam. There are no restrictions or special instructions.  Your physician has requested that you have an echocardiogram in May of 2020. Echocardiography is a painless test that uses sound waves to create images of your heart. It provides your doctor with information about the size and shape of your heart and how well your heart's chambers and valves are working. This procedure takes approximately one hour. There are no restrictions for this procedure.    Follow-Up: Your physician wants you to follow-up in: May with Dr. Reggy Eye will receive a reminder letter in the mail two months in advance. If you don't receive a letter, please call our office to schedule the follow-up appointment.   Any Other Special Instructions Will Be Listed Below (If Applicable).     If you need a refill on your cardiac medications before your next appointment, please call your pharmacy. Thank you for choosing  [facility]!    Low-Sodium Eating Plan Sodium, which is an element that makes up salt, helps you maintain a healthy balance of  fluids in your body. Too much sodium can increase your blood  pressure and cause fluid and waste to be held in your body. Your health care provider or dietitian may recommend following this plan if you have high blood pressure (hypertension), kidney disease, liver disease, or heart failure. Eating less sodium can help lower your blood pressure, reduce swelling, and protect your heart, liver, and kidneys. What are tips for following this plan? General guidelines  Most people on this plan should limit their sodium intake to 1,500-2,000 mg (milligrams) of sodium each day. Reading food labels  The Nutrition Facts label lists the amount of sodium in one serving of the food. If you eat more than one serving, you must multiply the listed amount of sodium by the number of servings.  Choose foods with less than 140 mg of sodium per serving.  Avoid foods with 300 mg of sodium or more per serving. Shopping  Look for lower-sodium products, often labeled as "low-sodium" or "no salt added."  Always check the sodium content even if foods are labeled as "unsalted" or "no salt added".  Buy fresh foods. ? Avoid canned foods and premade or frozen meals. ? Avoid canned, cured, or processed meats  Buy breads that have less than 80 mg of sodium per slice. Cooking  Eat more home-cooked food and less restaurant, buffet, and fast food.  Avoid adding salt when cooking. Use salt-free seasonings or herbs instead of table salt or sea salt. Check with your health care provider or pharmacist before using salt substitutes.  Cook with plant-based oils, such as canola, sunflower, or olive oil. Meal planning  When eating at a restaurant, ask that your food be prepared with less salt or no salt, if possible.  Avoid foods that contain MSG (monosodium glutamate). MSG is sometimes added to [place] food, bouillon, and some canned foods. What foods are recommended? The items listed may not be a complete list. Talk with  your dietitian about what dietary choices are best for you. Grains Low-sodium cereals, including oats, puffed wheat and rice, and shredded wheat. Low-sodium crackers. Unsalted rice. Unsalted pasta. Low-sodium bread. Whole-grain breads and whole-grain pasta. Vegetables Fresh or frozen vegetables. "No salt added" canned vegetables. "No salt added" tomato sauce and paste. Low-sodium or reduced-sodium tomato and vegetable juice. Fruits Fresh, frozen, or canned fruit. Fruit juice. Meats and other protein foods Fresh or frozen (no salt added) meat, poultry, seafood, and fish. Low-sodium canned tuna and salmon. Unsalted nuts. Dried peas, beans, and lentils without added salt. Unsalted canned beans. Eggs. Unsalted nut butters. Dairy Milk. Soy milk. Cheese that is naturally low in sodium, such as ricotta cheese, fresh mozzarella, or Swiss cheese Low-sodium or reduced-sodium cheese. Cream cheese. Yogurt. Fats and oils Unsalted butter. Unsalted margarine with no trans fat. Vegetable oils such as canola or olive oils. Seasonings and other foods Fresh and dried herbs and spices. Salt-free seasonings. Low-sodium mustard and ketchup. Sodium-free salad dressing. Sodium-free light mayonnaise. Fresh or refrigerated horseradish. Lemon juice. Vinegar. Homemade, reduced-sodium, or low-sodium soups. Unsalted popcorn and pretzels. Low-salt or salt-free chips. What foods are not recommended? The items listed may not be a complete list. Talk with your dietitian about what dietary choices are best for you. Grains Instant hot cereals. Bread stuffing, pancake, and biscuit mixes. Croutons. Seasoned rice or pasta mixes. Noodle soup cups. Boxed or frozen macaroni and cheese. Regular salted crackers. Self-rising flour. Vegetables Sauerkraut, pickled vegetables, and relishes. Olives. [place] fries. Onion rings. Regular canned vegetables (not low-sodium or reduced-sodium). Regular canned tomato sauce and paste (not low-sodium or  reduced-sodium). Regular tomato and vegetable juice (not low-sodium or reduced-sodium). Frozen vegetables in sauces. Meats and other protein foods Meat or fish that is salted, canned, smoked, spiced, or pickled. Bacon, ham, sausage, hotdogs, corned beef, chipped beef, packaged lunch meats, salt pork, jerky, pickled herring, anchovies, regular canned tuna, sardines, salted nuts. Dairy Processed cheese and cheese spreads. Cheese curds. Blue cheese. Feta cheese. String cheese. Regular cottage cheese. Buttermilk. Canned milk. Fats and oils Salted butter. Regular margarine. Ghee. Bacon fat. Seasonings and other foods Onion salt, garlic salt, seasoned salt, table salt, and sea salt. Canned and packaged gravies. Worcestershire sauce. Tartar sauce. Barbecue sauce. Teriyaki sauce. Soy sauce, including reduced-sodium. Steak sauce. Fish sauce. Oyster sauce. Cocktail sauce. Horseradish that you find on the shelf. Regular ketchup and mustard. Meat flavorings and tenderizers. Bouillon cubes. Hot sauce and Tabasco sauce. Premade or packaged marinades. Premade or packaged taco seasonings. Relishes. Regular salad dressings. Salsa. Potato and tortilla chips. Corn chips and puffs. Salted popcorn and pretzels. Canned or dried soups. Pizza. Frozen entrees and pot pies. Summary  Eating less sodium can help lower your blood pressure, reduce swelling, and protect your heart, liver, and kidneys.  Most people on this plan should limit their sodium intake to 1,500-2,000 mg (milligrams) of sodium each day.  Canned, boxed, and frozen foods are high in sodium. Restaurant foods, fast foods, and pizza are also very high in sodium. You also get sodium by adding salt to food.  Try to cook at home, eat more fresh fruits and vegetables, and eat less fast food, canned, processed, or prepared foods. This information is not intended to replace advice given to you by your health care provider. Make sure you discuss any questions you have  with your health care provider. Document Released: 04/09/2002 Document Revised: 10/11/2016 Document Reviewed: 10/11/2016 Elsevier Interactive Patient Education  2018 [employer].   Smoking Tobacco Information Smoking tobacco will very likely harm your health. Tobacco contains a poisonous (toxic), colorless chemical called nicotine. Nicotine affects the brain and makes tobacco addictive. This change in your brain can make it hard to stop smoking. Tobacco also has other toxic chemicals that can hurt your body and raise your risk of many cancers. How can smoking tobacco affect me? Smoking tobacco can increase your chances of having serious health conditions, such as:  Cancer. Smoking is most commonly associated with lung cancer, but can lead to cancer in other parts of the body.  Chronic obstructive pulmonary disease (COPD). This is a long-term lung condition that makes it hard to breathe. It also gets worse over time.  High blood pressure (hypertension), heart disease, stroke, or heart attack.  Lung infections, such as pneumonia.  Cataracts. This is when the lenses in the eyes become clouded.  Digestive problems. This may include peptic ulcers, heartburn, and gastroesophageal reflux disease (GERD).  Oral health problems, such as gum disease and tooth loss.  Loss of taste and smell.  Smoking can affect your appearance by causing:  Wrinkles.  Yellow or stained teeth, fingers, and fingernails.  Smoking tobacco can also affect your social life.  Many workplaces, [employer], hotels, and public places are tobacco-free. This means that you may experience challenges in finding places to smoke when away from home.  The cost of a smoking habit can be expensive. Expenses for someone who smokes come in two ways: ? You spend money on a regular basis to buy tobacco. ? Your health care costs in the long-term are higher if you smoke.  Tobacco smoke can also affect the health of those around  you. Children of smokers have greater chances of: ? Sudden infant death syndrome (SIDS). ? Ear infections. ? Lung infections.  What lifestyle changes can be made?  Do not start smoking. Quit if you already do.  To quit smoking: ? Make a plan to quit smoking and commit yourself to it. Look for programs to help you and ask your health care provider for recommendations and ideas. ? Talk with your health care provider about using nicotine replacement medicines to help you quit. Medicine replacement medicines include gum, lozenges, patches, sprays, or pills. ? Do not replace cigarette smoking with electronic cigarettes, which are commonly called e-cigarettes. The safety of e-cigarettes is not known, and some may contain harmful chemicals. ? Avoid places, people, or situations that tempt you to smoke. ? If you try to quit but return to smoking, don't give up hope. It is very common for people to try a number of times before they fully succeed. When you feel ready again, give it another try.  Quitting smoking might affect the way you eat as well as your weight. Be prepared to monitor your eating habits. Get support in planning and following a healthy diet.  Ask your health care provider about having regular tests (screenings) to check for cancer. This may include blood tests, imaging tests, and other tests.  Exercise regularly. Consider taking walks, joining a gym, or doing yoga or exercise classes.  Develop skills to manage your stress. These skills include meditation. What are the benefits of quitting smoking? By quitting smoking, you may:  Lower your risk of getting cancer and other diseases caused by smoking.  Live longer.  Breathe better.  Lower your blood pressure and heart rate.  Stop your addiction to tobacco.  Stop creating secondhand smoke that hurts other people.  Improve your sense of taste and smell.  Look better over time, due to having fewer wrinkles and less  staining.  What can happen if changes are not made? If you do not stop smoking, you may:  Get cancer and other diseases.  Develop COPD or other long-term (chronic) lung conditions.  Develop serious problems with your heart and blood vessels (cardiovascular system).  Need more tests to screen for problems caused by smoking.  Have higher, long-term healthcare costs from medicines or treatments related to smoking.  Continue to have worsening changes in your lungs, mouth, and nose.  Where to find support: To get support to quit smoking, consider:  Asking your health care provider for more information and resources.  Taking classes to learn more about quitting smoking.  Looking for local organizations that offer resources about quitting smoking.  Joining a support group for people who want to quit smoking in your local community.  Where to find more information: You may find more information about quitting smoking from:  HelpGuide.org: www.helpguide.org/articles/addictions/how-to-quit-smoking.htm  [employer]: smokefree.gov  American Lung Association: www.lung.org  Contact a health care provider if:  You have problems breathing.  Your lips, nose, or fingers turn blue.  You have chest pain.  You are coughing up blood.  You feel faint or you pass out.  You have other noticeable changes that cause you to worry. Summary  Smoking tobacco can negatively affect your health, the health of those around you, your finances, and your social life.  Do not start smoking. Quit if you already do. If you need help quitting, ask your health care provider.  Think about joining  a support group for people who want to quit smoking in your local community. There are many effective programs that will help you to quit this behavior. This information is not intended to replace advice given to you by your health care provider. Make sure you discuss any questions you have with your health  care provider. Document Released: 11/02/2016 Document Revised: 11/02/2016 Document Reviewed: 11/02/2016 Elsevier Interactive Patient Education  [address].      Signed, Jacolyn Reedy, [place]  04/10/2018 3:04 PM    [facility] [address] [place], [place], [place]  [address] Phone: [phone]; Fax: [phone]

## 2018-04-12 DIAGNOSIS — Z6832 Body mass index (BMI) 32.0-32.9, adult: Secondary | ICD-10-CM | POA: Diagnosis not present

## 2018-04-12 DIAGNOSIS — K1121 Acute sialoadenitis: Secondary | ICD-10-CM | POA: Diagnosis not present

## 2018-04-17 NOTE — Progress Notes (Signed)
Patient chart and cardiac notes reviewed with Dr Renold Don[place]eroth, [place] for [facility].

## 2018-04-18 ENCOUNTER — Encounter (HOSPITAL_BASED_OUTPATIENT_CLINIC_OR_DEPARTMENT_OTHER): Payer: Self-pay | Admitting: Physician Assistant

## 2018-04-18 DIAGNOSIS — M549 Dorsalgia, unspecified: Secondary | ICD-10-CM

## 2018-04-18 DIAGNOSIS — G8929 Other chronic pain: Secondary | ICD-10-CM | POA: Diagnosis present

## 2018-04-18 DIAGNOSIS — M19012 Primary osteoarthritis, left shoulder: Secondary | ICD-10-CM | POA: Diagnosis present

## 2018-04-18 DIAGNOSIS — Z7189 Other specified counseling: Secondary | ICD-10-CM

## 2018-04-18 DIAGNOSIS — M7542 Impingement syndrome of left shoulder: Secondary | ICD-10-CM | POA: Diagnosis present

## 2018-04-18 DIAGNOSIS — M75122 Complete rotator cuff tear or rupture of left shoulder, not specified as traumatic: Secondary | ICD-10-CM | POA: Diagnosis present

## 2018-04-18 DIAGNOSIS — F17200 Nicotine dependence, unspecified, uncomplicated: Secondary | ICD-10-CM | POA: Diagnosis present

## 2018-04-18 NOTE — H&P (Signed)
Gerald Mccann is an 57 y.o. male.   Chief Complaint: left shoulder rotator cuff tear HPI: [name] is a 57 year-old gentleman seen at the request of Dr. Cleophas DunkerBassett for significant left shoulder pain for many years.  Getting progressively worse.  Pain with overhead use and activity with night pain as well.  He has had a number of injections, both by [facility] and by Dr. Cleophas DunkerBassett.  Some of these injections have worked and other times they have not, but his pain is increasing.  He is a chronic pain patient under the care of [facility] and he takes Nucynta Oxycodone 10 four times a day under their care.  He is on permanent disability due to his chronic back pain  Past Medical History:  Diagnosis Date  . Arthrosis of left acromioclavicular joint   . Chronic back pain   . Complete rotator cuff tear of left shoulder   . Current every day smoker    smokes a pack and a half a day  . Hiatal hernia   . HTN (hypertension)   . Impingement syndrome of left shoulder   . Pain management contract discussed    post op pain meds to be managed by [facility] who currently manages his pain meds    Past Surgical History:  Procedure Laterality Date  . right knee surgery      Family History  Problem Relation Age of Onset  . Asthma Unknown   . Coronary artery disease Father   . Heart attack Father   . Coronary artery disease Mother   . Heart attack Brother [id number]       deceased   Social History:  reports that he has been smoking cigarettes.  He started smoking about 45 years ago. He has been smoking about 1.50 packs per day. He has never used smokeless tobacco. He reports that he drinks alcohol. He reports that he does not use drugs.  Allergies: No Known Allergies  No medications prior to admission.  No current facility-administered medications for this encounter.   Current Outpatient Medications:  .  amLODipine (NORVASC) 10 MG tablet, TAKE ONE TABLET BY MOUTH  DAILY., Disp: 180 tablet, Rfl: 0 .  aspirin 81 MG tablet, Take 81 mg by mouth daily., Disp: , Rfl:  .  atorvastatin (LIPITOR) 20 MG tablet, Take 1 tablet by mouth daily., Disp: , Rfl:  .  Blood Pressure Monitor MISC, Use as directed, Disp: 1 each, Rfl: 0 .  busPIRone (BUSPAR) 5 MG tablet, Take 5 mg by mouth 2 (two) times daily as needed. for anxiety, Disp: , Rfl: 2 .  cefUROXime (CEFTIN) 250 MG tablet, TAKE TWO (2) TABLETS BY MOUTH TWICE DAILY, Disp: , Rfl: 0 .  citalopram (CELEXA) 40 MG tablet, Take 40 mg by mouth daily., Disp: , Rfl: 0 .  fluticasone (FLONASE) 50 MCG/ACT nasal spray, Place 2 sprays into both nostrils daily., Disp: , Rfl:  .  methocarbamol (ROBAXIN) 750 MG tablet, Take 1 tablet by mouth 2 (two) times daily as needed., Disp: , Rfl:  .  NUCYNTA ER 50 MG TB12, Take 1 tablet by mouth every 12 (twelve) hours., Disp: , Rfl: 0 .  olmesartan (BENICAR) 20 MG tablet, Take 1 tablet by mouth daily., Disp: , Rfl:  .  omeprazole (PRILOSEC) 40 MG capsule, Take 1 capsule by mouth daily., Disp: , Rfl:  .  oxyCODONE-acetaminophen (PERCOCET) 10-325 MG tablet, TAKE ONE TABLET BY MOUTH EVERY 4 TO 6 HOURS  AS NEEDED., Disp: , Rfl: 0 .  ranitidine (ZANTAC) 150 MG tablet, Take by mouth., Disp: , Rfl:  .  valsartan (DIOVAN) 320 MG tablet, Take 1 tablet (320 mg total) by mouth daily., Disp: 90 tablet, Rfl: 3 .  varenicline (CHANTIX STARTING MONTH PAK) 0.5 MG X 11 & 1 MG X 42 tablet, Take one 0.5 mg tablet by mouth once daily for 3 days, then increase to one 0.5 mg tablet twice daily for 4 days, then increase to one 1 mg tablet twice daily. (Patient not taking: Reported on 02/18/2017), Disp: 53 tablet, Rfl: 0 .  VENTOLIN HFA 108 (90 Base) MCG/ACT inhaler, Inhale 2 puffs into the lungs every 4 (four) hours as needed., Disp: , Rfl:   No results found for this or any previous visit (from the past 48 hour(s)). No results found.  Review of Systems  Constitutional: Negative.   HENT: Negative.   Eyes:  Negative.   Respiratory: Negative.   Cardiovascular: Negative.   Gastrointestinal: Negative.   Genitourinary: Negative.   Musculoskeletal: Positive for back pain and joint pain.  Skin: Negative.   Neurological: Negative.   Endo/Heme/Allergies: Negative.   Psychiatric/Behavioral: Negative.     Height 6\' 1"  (1.854 m), weight 108.9 kg (240 lb). Physical Exam  Constitutional: He is oriented to person, place, and time. He appears well-developed.  HENT:  Head: Normocephalic.  Eyes: Pupils are equal, round, and reactive to light.  Neck: Neck supple.  Cardiovascular: Normal rate.  Respiratory: Effort normal.  GI: Soft.  Musculoskeletal:  Examination of his left shoulder reveals 85% range of motion with pain.  Mild weakness on rotator cuff stressing.  No instability.  Examination of his right shoulder reveals full range of motion without pain, weakness or instability.  Vascular exam: Pulses are 2+ and symmetric.  Neurologic exam: Distal motor and sensory examination is within normal limits.    Neurological: He is alert and oriented to person, place, and time.  Skin: Skin is warm and dry.  Psychiatric: He has a normal mood and affect.     Assessment Principal Problem:   Complete rotator cuff tear of left shoulder Active Problems:   Obesity, unspecified   Essential hypertension, benign   MURMUR   Aortic stenosis   Aortic root dilatation (HCC)   Impingement syndrome of left shoulder   Arthrosis of left acromioclavicular joint   Chronic back pain   Pain management contract discussed   Current every day smoker   Plan I spoke to Gerald Mccann concerning his left shoulder MRI that revealed high grade partial versus small complete rotator cuff tear, as well as impingement with AC joint osteoarthritis and adhesive capsulitis.  He has gotten minimal relief from our previous injection on January 31, 2018 and from previous treatments by Dr. Cleophas Dunker.  He is under the care of Dr. Bunnie Pion at  [facility], as well as the [facility] in [place].  He is permanently disabled due to chronic back pain and he takes Oxycodone 10 mg, four pills a day.  Other medications that he is on are Amlodipine, Benicar, Ventolin inhaler, Omeprazole and Celexa.  I have told him with this finding and lack of response to conservative care, recommend that we proceed with left shoulder exam under anesthesia, manipulation, arthroscopic lysis of adhesions with rotator cuff and labrum debridement versus repair with subacromial decompression and DCE.  Risks, complications and benefits of the surgery have been described to him in detail and he understands  this completely.  We will plan on setting him up for this at some point in the near future.  His pain medication will be managed postoperatively by [facility].    Gerald Hise J Krist Rosenboom, PA-C 04/18/2018, 11:56 AM

## 2018-04-19 DIAGNOSIS — M25512 Pain in left shoulder: Secondary | ICD-10-CM | POA: Diagnosis not present

## 2018-04-21 ENCOUNTER — Encounter (HOSPITAL_BASED_OUTPATIENT_CLINIC_OR_DEPARTMENT_OTHER): Payer: Self-pay

## 2018-04-21 ENCOUNTER — Ambulatory Visit (HOSPITAL_BASED_OUTPATIENT_CLINIC_OR_DEPARTMENT_OTHER): Admit: 2018-04-21 | Payer: Medicare Other | Admitting: Orthopedic Surgery

## 2018-04-21 HISTORY — DX: Impingement syndrome of left shoulder: M75.42

## 2018-04-21 HISTORY — DX: Other specified counseling: Z71.89

## 2018-04-21 HISTORY — DX: Complete rotator cuff tear or rupture of left shoulder, not specified as traumatic: M75.122

## 2018-04-21 HISTORY — DX: Nicotine dependence, unspecified, uncomplicated: F17.200

## 2018-04-21 HISTORY — DX: Dorsalgia, unspecified: M54.9

## 2018-04-21 HISTORY — DX: Primary osteoarthritis, left shoulder: M19.012

## 2018-04-21 HISTORY — DX: Other chronic pain: G89.29

## 2018-04-21 SURGERY — SHOULDER ARTHROSCOPY WITH SUBACROMIAL DECOMPRESSION AND DISTAL CLAVICLE EXCISION
Anesthesia: General | Laterality: Left

## 2018-05-25 DIAGNOSIS — M25522 Pain in left elbow: Secondary | ICD-10-CM | POA: Diagnosis not present

## 2018-06-07 ENCOUNTER — Ambulatory Visit (INDEPENDENT_AMBULATORY_CARE_PROVIDER_SITE_OTHER): Payer: Medicare Other

## 2018-06-07 DIAGNOSIS — R0989 Other specified symptoms and signs involving the circulatory and respiratory systems: Secondary | ICD-10-CM | POA: Diagnosis not present

## 2018-06-22 DIAGNOSIS — J209 Acute bronchitis, unspecified: Secondary | ICD-10-CM | POA: Diagnosis not present

## 2018-06-22 DIAGNOSIS — J0101 Acute recurrent maxillary sinusitis: Secondary | ICD-10-CM | POA: Diagnosis not present

## 2018-08-23 DIAGNOSIS — Z6833 Body mass index (BMI) 33.0-33.9, adult: Secondary | ICD-10-CM | POA: Diagnosis not present

## 2018-08-23 DIAGNOSIS — R011 Cardiac murmur, unspecified: Secondary | ICD-10-CM | POA: Diagnosis not present

## 2018-08-23 DIAGNOSIS — I10 Essential (primary) hypertension: Secondary | ICD-10-CM | POA: Diagnosis not present

## 2018-08-23 DIAGNOSIS — J441 Chronic obstructive pulmonary disease with (acute) exacerbation: Secondary | ICD-10-CM | POA: Diagnosis not present

## 2018-09-18 ENCOUNTER — Other Ambulatory Visit: Payer: Self-pay | Admitting: Cardiovascular Disease

## 2018-10-09 DIAGNOSIS — Z6834 Body mass index (BMI) 34.0-34.9, adult: Secondary | ICD-10-CM | POA: Diagnosis not present

## 2018-10-09 DIAGNOSIS — I10 Essential (primary) hypertension: Secondary | ICD-10-CM | POA: Diagnosis not present

## 2018-10-09 DIAGNOSIS — F17210 Nicotine dependence, cigarettes, uncomplicated: Secondary | ICD-10-CM | POA: Diagnosis not present

## 2018-10-09 DIAGNOSIS — R011 Cardiac murmur, unspecified: Secondary | ICD-10-CM | POA: Diagnosis not present

## 2018-10-18 DIAGNOSIS — I10 Essential (primary) hypertension: Secondary | ICD-10-CM | POA: Diagnosis not present

## 2018-10-18 DIAGNOSIS — Z1331 Encounter for screening for depression: Secondary | ICD-10-CM | POA: Diagnosis not present

## 2018-10-18 DIAGNOSIS — R011 Cardiac murmur, unspecified: Secondary | ICD-10-CM | POA: Diagnosis not present

## 2018-10-18 DIAGNOSIS — G252 Other specified forms of tremor: Secondary | ICD-10-CM | POA: Diagnosis not present

## 2018-10-18 DIAGNOSIS — F17210 Nicotine dependence, cigarettes, uncomplicated: Secondary | ICD-10-CM | POA: Diagnosis not present

## 2018-10-18 DIAGNOSIS — Z1389 Encounter for screening for other disorder: Secondary | ICD-10-CM | POA: Diagnosis not present

## 2018-12-04 DIAGNOSIS — Z6833 Body mass index (BMI) 33.0-33.9, adult: Secondary | ICD-10-CM | POA: Diagnosis not present

## 2018-12-04 DIAGNOSIS — R5383 Other fatigue: Secondary | ICD-10-CM | POA: Diagnosis not present

## 2018-12-04 DIAGNOSIS — K625 Hemorrhage of anus and rectum: Secondary | ICD-10-CM | POA: Diagnosis not present

## 2018-12-14 DIAGNOSIS — M25521 Pain in right elbow: Secondary | ICD-10-CM | POA: Diagnosis not present

## 2018-12-22 ENCOUNTER — Ambulatory Visit (INDEPENDENT_AMBULATORY_CARE_PROVIDER_SITE_OTHER): Payer: Medicare Other | Admitting: Internal Medicine

## 2018-12-28 ENCOUNTER — Ambulatory Visit (INDEPENDENT_AMBULATORY_CARE_PROVIDER_SITE_OTHER): Payer: Medicare Other | Admitting: Internal Medicine

## 2019-01-08 ENCOUNTER — Ambulatory Visit (INDEPENDENT_AMBULATORY_CARE_PROVIDER_SITE_OTHER): Payer: Medicare Other | Admitting: Internal Medicine

## 2019-01-09 DIAGNOSIS — M7711 Lateral epicondylitis, right elbow: Secondary | ICD-10-CM | POA: Diagnosis not present

## 2019-01-29 DIAGNOSIS — M47816 Spondylosis without myelopathy or radiculopathy, lumbar region: Secondary | ICD-10-CM | POA: Diagnosis not present

## 2019-01-29 DIAGNOSIS — M545 Low back pain: Secondary | ICD-10-CM | POA: Diagnosis not present

## 2019-01-30 DIAGNOSIS — K1120 Sialoadenitis, unspecified: Secondary | ICD-10-CM | POA: Diagnosis not present

## 2019-03-06 DIAGNOSIS — J0101 Acute recurrent maxillary sinusitis: Secondary | ICD-10-CM | POA: Diagnosis not present

## 2019-03-06 DIAGNOSIS — I714 Abdominal aortic aneurysm, without rupture: Secondary | ICD-10-CM | POA: Diagnosis not present

## 2019-03-21 ENCOUNTER — Telehealth: Payer: Self-pay | Admitting: *Deleted

## 2019-03-21 NOTE — Telephone Encounter (Signed)
   Primary Cardiologist:  Prentice Docker, MD   Patient contacted.  History reviewed.  No symptoms to suggest any unstable cardiac conditions.  Based on discussion, with current pandemic situation, we will be postponing this appointment for Gerald Mccann with a plan for f/u on 06/19/2019.  If symptoms change, he has been instructed to contact our office.      Marland Kitchen

## 2019-03-28 ENCOUNTER — Ambulatory Visit: Payer: Medicare Other | Admitting: Cardiovascular Disease

## 2019-04-19 DIAGNOSIS — M545 Low back pain: Secondary | ICD-10-CM | POA: Diagnosis not present

## 2019-04-19 DIAGNOSIS — K1120 Sialoadenitis, unspecified: Secondary | ICD-10-CM | POA: Diagnosis not present

## 2019-05-01 DIAGNOSIS — J441 Chronic obstructive pulmonary disease with (acute) exacerbation: Secondary | ICD-10-CM | POA: Diagnosis not present

## 2019-05-23 DIAGNOSIS — I714 Abdominal aortic aneurysm, without rupture: Secondary | ICD-10-CM | POA: Diagnosis not present

## 2019-05-23 DIAGNOSIS — Z79899 Other long term (current) drug therapy: Secondary | ICD-10-CM | POA: Diagnosis not present

## 2019-05-23 DIAGNOSIS — M792 Neuralgia and neuritis, unspecified: Secondary | ICD-10-CM | POA: Diagnosis not present

## 2019-05-23 DIAGNOSIS — I10 Essential (primary) hypertension: Secondary | ICD-10-CM | POA: Diagnosis not present

## 2019-06-01 DIAGNOSIS — M545 Low back pain: Secondary | ICD-10-CM | POA: Diagnosis not present

## 2019-06-01 DIAGNOSIS — Z79899 Other long term (current) drug therapy: Secondary | ICD-10-CM | POA: Diagnosis not present

## 2019-06-05 DIAGNOSIS — M5416 Radiculopathy, lumbar region: Secondary | ICD-10-CM | POA: Diagnosis not present

## 2019-06-05 DIAGNOSIS — M545 Low back pain: Secondary | ICD-10-CM | POA: Diagnosis not present

## 2019-06-11 DIAGNOSIS — I10 Essential (primary) hypertension: Secondary | ICD-10-CM | POA: Diagnosis not present

## 2019-06-11 DIAGNOSIS — Z79899 Other long term (current) drug therapy: Secondary | ICD-10-CM | POA: Diagnosis not present

## 2019-06-11 DIAGNOSIS — M545 Low back pain: Secondary | ICD-10-CM | POA: Diagnosis not present

## 2019-06-11 DIAGNOSIS — G8929 Other chronic pain: Secondary | ICD-10-CM | POA: Diagnosis not present

## 2019-06-18 ENCOUNTER — Telehealth: Payer: Self-pay | Admitting: *Deleted

## 2019-06-18 ENCOUNTER — Encounter: Payer: Self-pay | Admitting: *Deleted

## 2019-06-18 ENCOUNTER — Other Ambulatory Visit: Payer: Self-pay | Admitting: Cardiovascular Disease

## 2019-06-18 NOTE — Telephone Encounter (Signed)
Patient verbally consented for telehealth visits with [facility] and understands that his insurance company will be billed for the encounter.   

## 2019-06-19 ENCOUNTER — Encounter: Payer: Self-pay | Admitting: Cardiovascular Disease

## 2019-06-19 ENCOUNTER — Telehealth (INDEPENDENT_AMBULATORY_CARE_PROVIDER_SITE_OTHER): Payer: Medicare Other | Admitting: Cardiovascular Disease

## 2019-06-19 ENCOUNTER — Encounter: Payer: Self-pay | Admitting: *Deleted

## 2019-06-19 VITALS — BP 138/80 | Ht 73.0 in | Wt 250.0 lb

## 2019-06-19 DIAGNOSIS — I77810 Thoracic aortic ectasia: Secondary | ICD-10-CM

## 2019-06-19 DIAGNOSIS — I350 Nonrheumatic aortic (valve) stenosis: Secondary | ICD-10-CM

## 2019-06-19 DIAGNOSIS — I10 Essential (primary) hypertension: Secondary | ICD-10-CM

## 2019-06-19 DIAGNOSIS — F17200 Nicotine dependence, unspecified, uncomplicated: Secondary | ICD-10-CM

## 2019-06-19 NOTE — Addendum Note (Signed)
Addended by: Merlene Laughter on: 06/19/2019 01:30 PM   Modules accepted: Orders

## 2019-06-19 NOTE — Progress Notes (Signed)
Virtual Visit via Telephone Note   This visit type was conducted due to national recommendations for restrictions regarding the COVID-19 Pandemic (e.g. social distancing) in an effort to limit this patient's exposure and mitigate transmission in our community.  Due to his co-morbid illnesses, this patient is at least at moderate risk for complications without adequate follow up.  This format is felt to be most appropriate for this patient at this time.  The patient did not have access to video technology/had technical difficulties with video requiring transitioning to audio format only (telephone).  All issues noted in this document were discussed and addressed.  No physical exam could be performed with this format.  Please refer to the patient's chart for his  consent [facility].   Date:  06/19/2019   ID:  Gerald LenisMichael J Knight, DOB 05/04/1961, MRN [id number]  Patient Location: Home Provider Location: Office  PCP:  Lovey NewcomerBoyd, William S, PA  Cardiologist:  Prentice DockerSuresh Saniyah Mondesir, MD  Electrophysiologist:  None   Evaluation Performed:  Follow-Up Visit  Chief Complaint:  Aortic stenosis  History of Present Illness:    Gerald Mccann is a 58 y.o. male with a history of aortic stenosis, hypertension, aortic root dilatation, and atypical chest pain.  Echocardiogram on 03/10/2017 demonstrated normal LV systolic function, LVEF 60 to 08%65%, moderate LVH, mild to moderate aortic stenosis, and mild aortic root and proximal ascending aortic dilatation, both of which measured 37 mm.  He denies chest pain. Chronic shortness of breath from COPD is stable. He denies leg swelling and syncope.  He decided not to undergo shoulder surgery last year. He has gotten steroid shots.  He denies fevers.  Past Medical History:  Diagnosis Date  . Arthrosis of left acromioclavicular joint   . Chronic back pain   . Complete rotator cuff tear of left shoulder   . Current every day smoker    smokes a pack  and a half a day  . Hiatal hernia   . HTN (hypertension)   . Impingement syndrome of left shoulder   . Pain management contract discussed    post op pain meds to be managed by [facility] who currently manages his pain meds   Past Surgical History:  Procedure Laterality Date  . right knee surgery       Current Meds  Medication Sig  . amLODipine (NORVASC) 10 MG tablet TAKE ONE TABLET BY MOUTH DAILY.  Marland Kitchen. aspirin 81 MG tablet Take 81 mg by mouth daily.  Marland Kitchen. atorvastatin (LIPITOR) 20 MG tablet Take 1 tablet by mouth daily.  . Blood Pressure Monitor MISC Use as directed  . busPIRone (BUSPAR) 5 MG tablet Take 5 mg by mouth 2 (two) times daily as needed. for anxiety  . citalopram (CELEXA) 40 MG tablet Take 40 mg by mouth daily.  . cyclobenzaprine (FLEXERIL) 10 MG tablet Take 10 mg by mouth 2 (two) times daily.  . diclofenac sodium (VOLTAREN) 1 % GEL Apply 1 application topically 4 (four) times daily as needed.  . fluticasone (FLONASE) 50 MCG/ACT nasal spray Place 2 sprays into both nostrils daily.  . methocarbamol (ROBAXIN) 750 MG tablet Take 1 tablet by mouth 2 (two) times daily as needed.  Marland Kitchen. olmesartan (BENICAR) 20 MG tablet Take 1 tablet by mouth daily.  Marland Kitchen. omeprazole (PRILOSEC) 40 MG capsule Take 1 capsule by mouth daily.  Marland Kitchen. oxyCODONE-acetaminophen (PERCOCET) 10-325 MG tablet TAKE ONE TABLET BY MOUTH EVERY 4 TO 6 HOURS AS NEEDED.  .Marland Kitchen  VENTOLIN HFA 108 (90 Base) MCG/ACT inhaler Inhale 2 puffs into the lungs every 4 (four) hours as needed.     Allergies:   Patient has no known allergies.   Social History   Tobacco Use  . Smoking status: Current Every Day Smoker    Packs/day: 1.50    Types: Cigarettes    Start date: 09/01/1972  . Smokeless tobacco: Never Used  Substance Use Topics  . Alcohol use: Yes    Alcohol/week: 0.0 standard drinks    Comment: 1 to 2 beers q month  . Drug use: No     Family Hx: The patient's family history includes Asthma in an other family  member; Coronary artery disease in his father and mother; Heart attack in his father; Heart attack (age of onset: 26) in his brother.  ROS:   Please see the history of present illness.     All other systems reviewed and are negative.   Prior CV studies:   The following studies were reviewed today:  Carotid Dopplers on 06/07/2018 showed 1 to 39% bilateral internal carotid artery stenosis.  Labs/Other Tests and Data Reviewed:    EKG:  No ECG reviewed.  Recent Labs: No results found for requested labs within last 8760 hours.   Recent Lipid Panel No results found for: CHOL, TRIG, HDL, CHOLHDL, LDLCALC, LDLDIRECT  Wt Readings from Last 3 Encounters:  06/19/19 250 lb (113.4 kg)  04/10/18 245 lb (111.1 kg)  02/18/17 244 lb (110.7 kg)     Objective:    Vital Signs:  BP 138/80   Ht 6\' 1"  (1.854 m)   Wt 250 lb (113.4 kg)   BMI 32.98 kg/m    VITAL SIGNS:  reviewed  ASSESSMENT & PLAN:    1.  Aortic stenosis: Mild to moderate in severity by echocardiogram in 2018.  I will obtain a follow-up echocardiogram.  2.  Hypertension: Blood pressure is reasonably controlled.  No changes to therapy.  3.  Aortic root dilatation: I will obtain a follow-up echocardiogram.  4.  Tobacco abuse.    COVID-19 Education: The signs and symptoms of COVID-19 were discussed with the patient and how to seek care for testing (follow up with PCP or arrange E-visit).  The importance of social distancing was discussed today.  Time:   Today, I have spent 10 minutes with the patient with telehealth technology discussing the above problems.     Medication Adjustments/Labs and Tests Ordered: Current medicines are reviewed at length with the patient today.  Concerns regarding medicines are outlined above.   Tests Ordered: No orders of the defined types were placed in this encounter.   Medication Changes: No orders of the defined types were placed in this encounter.   Follow Up:  Virtual Visit or  In Person in 1 year(s)  Signed, Kate Sable, MD  06/19/2019 1:19 PM     [facility]

## 2019-06-19 NOTE — Patient Instructions (Addendum)

## 2019-06-29 DIAGNOSIS — Z79899 Other long term (current) drug therapy: Secondary | ICD-10-CM | POA: Diagnosis not present

## 2019-06-29 DIAGNOSIS — M545 Low back pain: Secondary | ICD-10-CM | POA: Diagnosis not present

## 2019-06-29 DIAGNOSIS — G8929 Other chronic pain: Secondary | ICD-10-CM | POA: Diagnosis not present

## 2019-06-29 DIAGNOSIS — I10 Essential (primary) hypertension: Secondary | ICD-10-CM | POA: Diagnosis not present

## 2019-07-05 DIAGNOSIS — M545 Low back pain: Secondary | ICD-10-CM | POA: Diagnosis not present

## 2019-07-05 DIAGNOSIS — M79605 Pain in left leg: Secondary | ICD-10-CM | POA: Diagnosis not present

## 2019-07-05 DIAGNOSIS — M5416 Radiculopathy, lumbar region: Secondary | ICD-10-CM | POA: Diagnosis not present

## 2019-07-11 ENCOUNTER — Other Ambulatory Visit: Payer: Self-pay

## 2019-07-11 ENCOUNTER — Ambulatory Visit (INDEPENDENT_AMBULATORY_CARE_PROVIDER_SITE_OTHER): Payer: Medicare Other

## 2019-07-11 DIAGNOSIS — I350 Nonrheumatic aortic (valve) stenosis: Secondary | ICD-10-CM | POA: Diagnosis not present

## 2019-07-12 ENCOUNTER — Other Ambulatory Visit: Payer: Self-pay | Admitting: Physician Assistant

## 2019-07-12 DIAGNOSIS — M545 Low back pain, unspecified: Secondary | ICD-10-CM

## 2019-07-13 ENCOUNTER — Telehealth: Payer: Self-pay | Admitting: *Deleted

## 2019-07-13 NOTE — Telephone Encounter (Signed)
Notes recorded by Laurine Blazer, LPN on 0/30/1499 at 6:92 PM EDT  Patient notified. Copy to pmd.  ------   Notes recorded by Herminio Commons, MD on 07/11/2019 at 1:51 PM EDT  Pumping function remains normal. Aortic stenosis remains mild/mild to moderate in severity.

## 2019-07-30 DIAGNOSIS — M545 Low back pain: Secondary | ICD-10-CM | POA: Diagnosis not present

## 2019-07-30 DIAGNOSIS — Z79899 Other long term (current) drug therapy: Secondary | ICD-10-CM | POA: Diagnosis not present

## 2019-07-30 DIAGNOSIS — G8929 Other chronic pain: Secondary | ICD-10-CM | POA: Diagnosis not present

## 2019-07-30 DIAGNOSIS — Z2821 Immunization not carried out because of patient refusal: Secondary | ICD-10-CM | POA: Diagnosis not present

## 2019-08-08 ENCOUNTER — Other Ambulatory Visit: Payer: Medicare Other

## 2019-08-29 ENCOUNTER — Other Ambulatory Visit: Payer: Medicare Other

## 2019-08-29 DIAGNOSIS — Z79899 Other long term (current) drug therapy: Secondary | ICD-10-CM | POA: Diagnosis not present

## 2019-08-29 DIAGNOSIS — F17210 Nicotine dependence, cigarettes, uncomplicated: Secondary | ICD-10-CM | POA: Diagnosis not present

## 2019-08-31 DIAGNOSIS — E782 Mixed hyperlipidemia: Secondary | ICD-10-CM | POA: Diagnosis not present

## 2019-08-31 DIAGNOSIS — I10 Essential (primary) hypertension: Secondary | ICD-10-CM | POA: Diagnosis not present

## 2019-09-10 DIAGNOSIS — M47816 Spondylosis without myelopathy or radiculopathy, lumbar region: Secondary | ICD-10-CM | POA: Diagnosis not present

## 2019-09-10 DIAGNOSIS — M545 Low back pain: Secondary | ICD-10-CM | POA: Diagnosis not present

## 2019-09-10 DIAGNOSIS — M47814 Spondylosis without myelopathy or radiculopathy, thoracic region: Secondary | ICD-10-CM | POA: Diagnosis not present

## 2019-09-10 DIAGNOSIS — M5134 Other intervertebral disc degeneration, thoracic region: Secondary | ICD-10-CM | POA: Diagnosis not present

## 2019-09-10 DIAGNOSIS — M5416 Radiculopathy, lumbar region: Secondary | ICD-10-CM | POA: Diagnosis not present

## 2019-09-10 DIAGNOSIS — G894 Chronic pain syndrome: Secondary | ICD-10-CM | POA: Diagnosis not present

## 2019-09-17 DIAGNOSIS — J019 Acute sinusitis, unspecified: Secondary | ICD-10-CM | POA: Diagnosis not present

## 2019-09-20 ENCOUNTER — Ambulatory Visit
Admission: RE | Admit: 2019-09-20 | Discharge: 2019-09-20 | Disposition: A | Discharge status: Home / Self Care | Payer: Medicare Other | Source: Ambulatory Visit | Attending: Physician Assistant | Admitting: Physician Assistant

## 2019-09-20 ENCOUNTER — Other Ambulatory Visit: Payer: Self-pay

## 2019-09-20 DIAGNOSIS — M545 Low back pain, unspecified: Secondary | ICD-10-CM

## 2019-09-26 DIAGNOSIS — G8929 Other chronic pain: Secondary | ICD-10-CM | POA: Diagnosis not present

## 2019-09-26 DIAGNOSIS — Z79899 Other long term (current) drug therapy: Secondary | ICD-10-CM | POA: Diagnosis not present

## 2019-09-26 DIAGNOSIS — F17210 Nicotine dependence, cigarettes, uncomplicated: Secondary | ICD-10-CM | POA: Diagnosis not present

## 2019-09-26 DIAGNOSIS — M545 Low back pain: Secondary | ICD-10-CM | POA: Diagnosis not present

## 2019-10-08 DIAGNOSIS — J019 Acute sinusitis, unspecified: Secondary | ICD-10-CM | POA: Diagnosis not present

## 2019-11-01 DIAGNOSIS — I10 Essential (primary) hypertension: Secondary | ICD-10-CM | POA: Diagnosis not present

## 2019-11-01 DIAGNOSIS — E782 Mixed hyperlipidemia: Secondary | ICD-10-CM | POA: Diagnosis not present

## 2019-11-07 DIAGNOSIS — E782 Mixed hyperlipidemia: Secondary | ICD-10-CM | POA: Diagnosis not present

## 2019-11-07 DIAGNOSIS — I714 Abdominal aortic aneurysm, without rupture: Secondary | ICD-10-CM | POA: Diagnosis not present

## 2019-11-07 DIAGNOSIS — J0101 Acute recurrent maxillary sinusitis: Secondary | ICD-10-CM | POA: Diagnosis not present

## 2019-11-07 DIAGNOSIS — I10 Essential (primary) hypertension: Secondary | ICD-10-CM | POA: Diagnosis not present

## 2019-11-13 DIAGNOSIS — Z136 Encounter for screening for cardiovascular disorders: Secondary | ICD-10-CM | POA: Diagnosis not present

## 2019-11-13 DIAGNOSIS — I714 Abdominal aortic aneurysm, without rupture: Secondary | ICD-10-CM | POA: Diagnosis not present

## 2019-11-13 DIAGNOSIS — I700 Atherosclerosis of aorta: Secondary | ICD-10-CM | POA: Diagnosis not present

## 2019-11-13 DIAGNOSIS — Z87891 Personal history of nicotine dependence: Secondary | ICD-10-CM | POA: Diagnosis not present

## 2019-11-22 DIAGNOSIS — M545 Low back pain: Secondary | ICD-10-CM | POA: Diagnosis not present

## 2019-11-22 DIAGNOSIS — Z79899 Other long term (current) drug therapy: Secondary | ICD-10-CM | POA: Diagnosis not present

## 2019-11-22 DIAGNOSIS — G8929 Other chronic pain: Secondary | ICD-10-CM | POA: Diagnosis not present

## 2019-11-30 DIAGNOSIS — I10 Essential (primary) hypertension: Secondary | ICD-10-CM | POA: Diagnosis not present

## 2019-11-30 DIAGNOSIS — E7849 Other hyperlipidemia: Secondary | ICD-10-CM | POA: Diagnosis not present

## 2019-12-19 DIAGNOSIS — G8929 Other chronic pain: Secondary | ICD-10-CM | POA: Diagnosis not present

## 2019-12-19 DIAGNOSIS — Z79899 Other long term (current) drug therapy: Secondary | ICD-10-CM | POA: Diagnosis not present

## 2019-12-19 DIAGNOSIS — M545 Low back pain: Secondary | ICD-10-CM | POA: Diagnosis not present

## 2019-12-28 DIAGNOSIS — J441 Chronic obstructive pulmonary disease with (acute) exacerbation: Secondary | ICD-10-CM | POA: Diagnosis not present

## 2019-12-28 DIAGNOSIS — I10 Essential (primary) hypertension: Secondary | ICD-10-CM | POA: Diagnosis not present

## 2019-12-28 DIAGNOSIS — E7849 Other hyperlipidemia: Secondary | ICD-10-CM | POA: Diagnosis not present

## 2019-12-28 DIAGNOSIS — F17210 Nicotine dependence, cigarettes, uncomplicated: Secondary | ICD-10-CM | POA: Diagnosis not present

## 2020-01-16 DIAGNOSIS — G8929 Other chronic pain: Secondary | ICD-10-CM | POA: Diagnosis not present

## 2020-01-16 DIAGNOSIS — I10 Essential (primary) hypertension: Secondary | ICD-10-CM | POA: Diagnosis not present

## 2020-01-16 DIAGNOSIS — Z79899 Other long term (current) drug therapy: Secondary | ICD-10-CM | POA: Diagnosis not present

## 2020-01-16 DIAGNOSIS — M545 Low back pain: Secondary | ICD-10-CM | POA: Diagnosis not present

## 2020-01-17 IMAGING — MR MR SHOULDER*L* W/O CM
5 series · 35 of 40 positions shown · non-contrast
Comparison: None.

CLINICAL DATA: Chronic left shoulder pain and limited range of
motion.

EXAM:
MRI OF THE LEFT SHOULDER WITHOUT CONTRAST
TECHNIQUE: Multiplanar, multisequence MR imaging of the shoulder was performed.
No intravenous contrast was administered.

[Series 5: T2 fat-sat · axial · 4.0mm · 0.44mm/px · z∈[-47,+41]mm · 8 of 20 slices shown (1 of 3)]
[im 1/20]
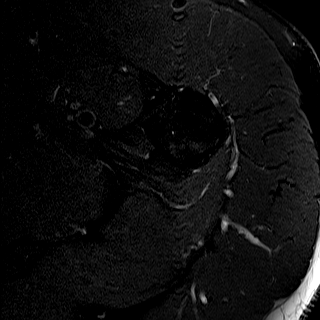
[im 3/20]
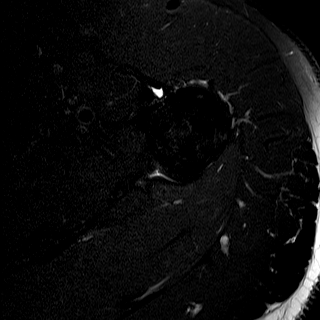
[im 6/20]
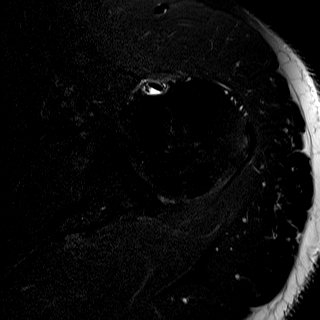
[im 9/20]
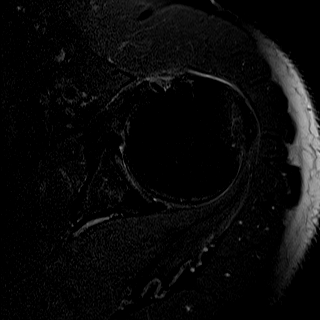
[im 11/20]
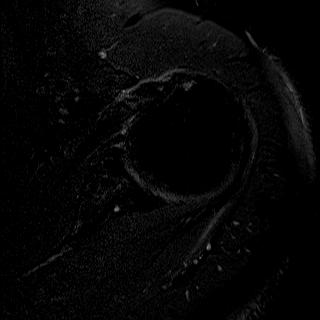
[im 14/20]
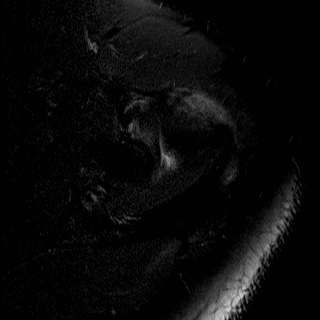
[im 17/20]
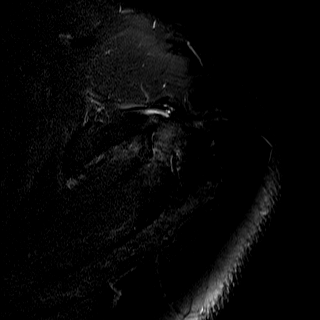
[im 20/20]
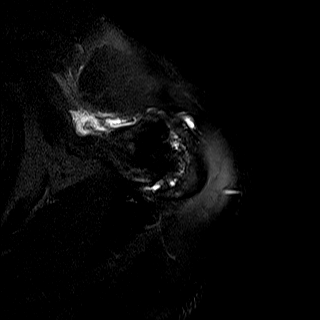

[Series 6: T2 fat-sat · oblique · 4.0mm · 0.50mm/px · 8 of 18 slices shown (2 of 3)]
[im 1/18]
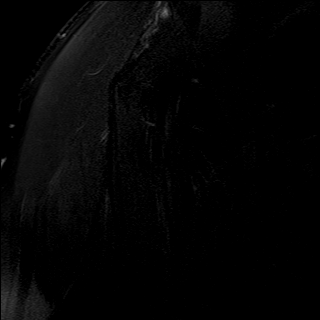
[im 3/18]
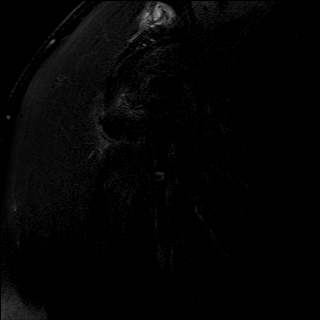
[im 5/18]
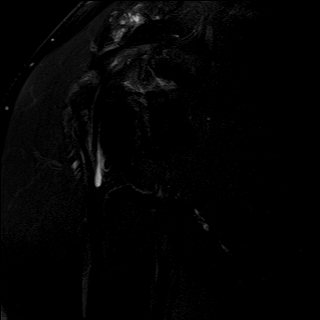
[im 8/18]
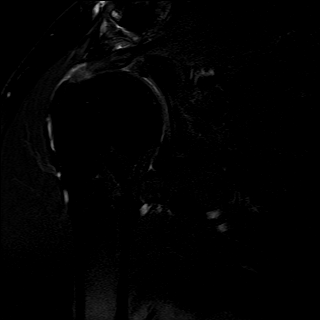
[im 10/18]
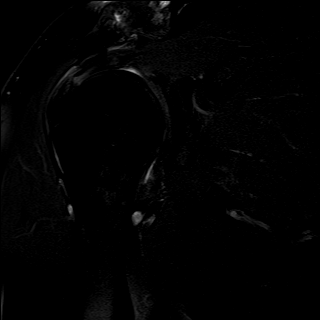
[im 13/18]
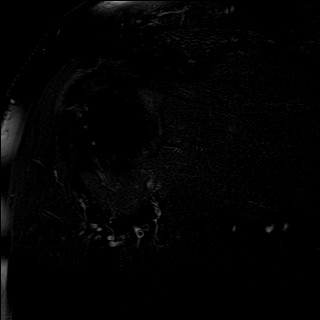
[im 15/18]
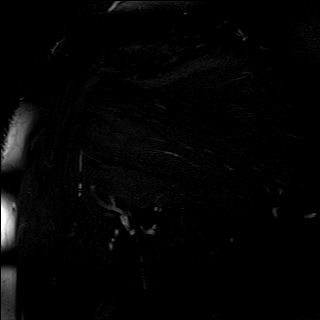
[im 18/18]
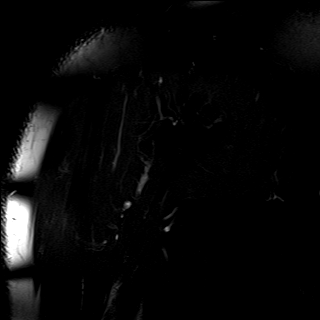

[Series 7: PD · oblique · 4.0mm · 0.50mm/px · 8 of 18 slices shown]
[im 1/18]
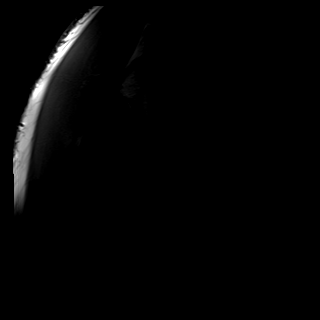
[im 3/18]
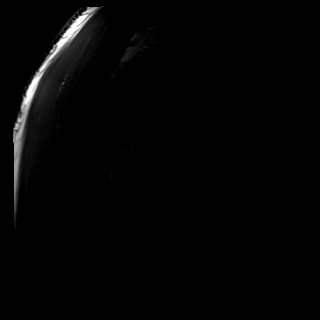
[im 5/18]
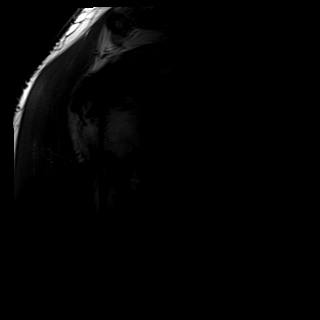
[im 8/18]
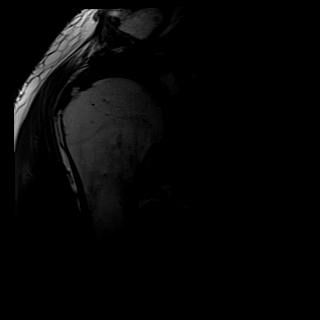
[im 10/18]
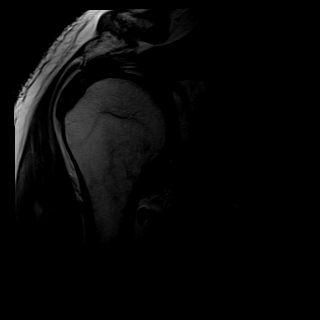
[im 13/18]
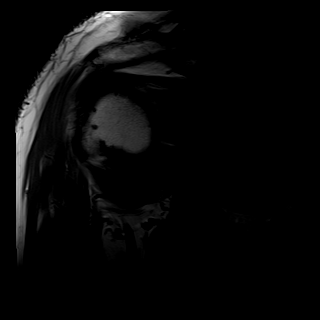
[im 15/18]
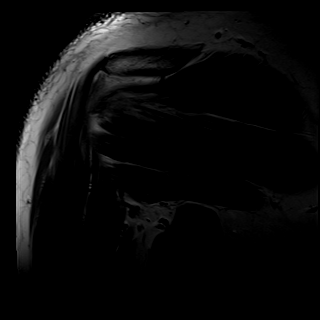
[im 18/18]
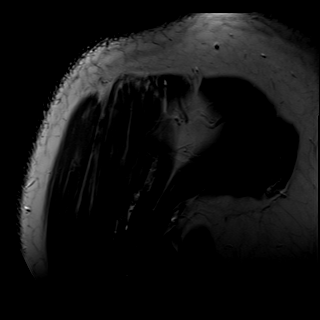

[Series 8: T2 fat-sat · oblique · 4.0mm · 0.50mm/px · 8 of 20 slices shown (3 of 3)]
[im 1/20]
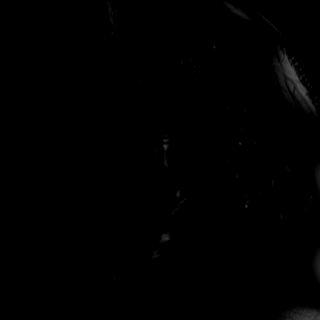
[im 3/20]
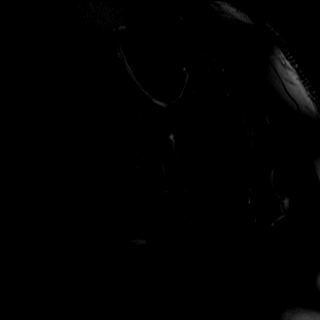
[im 6/20]
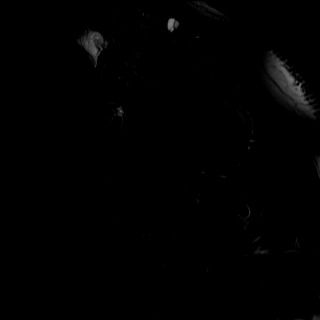
[im 9/20]
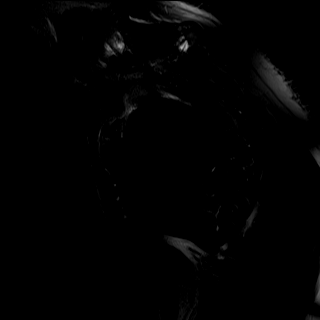
[im 11/20]
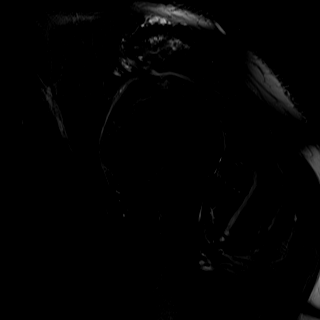
[im 14/20]
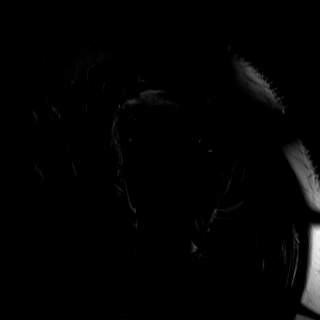
[im 17/20]
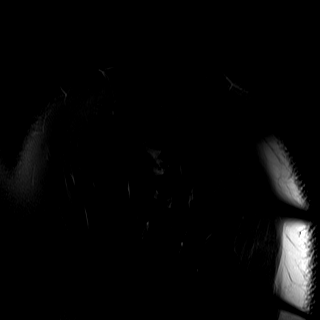
[im 20/20]
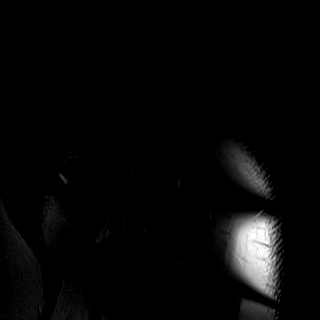

[Series 9: T1 · oblique · 4.0mm · 0.25mm/px · 3 of 20 slices shown]
[im 1/20]
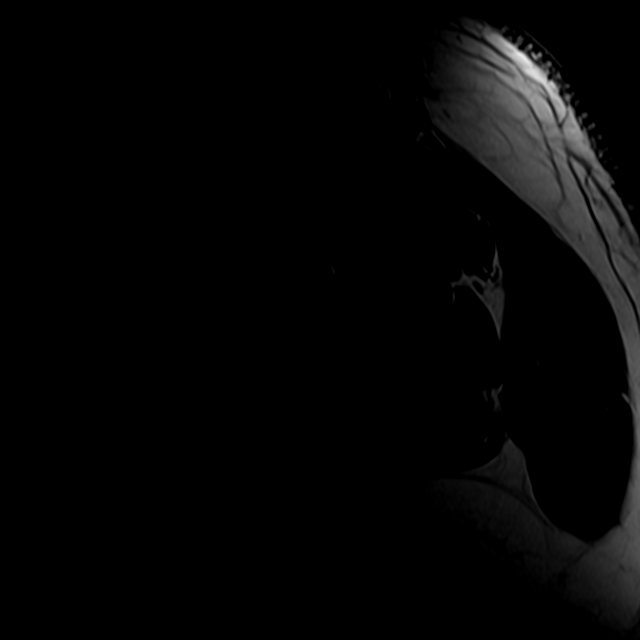
[im 3/20]
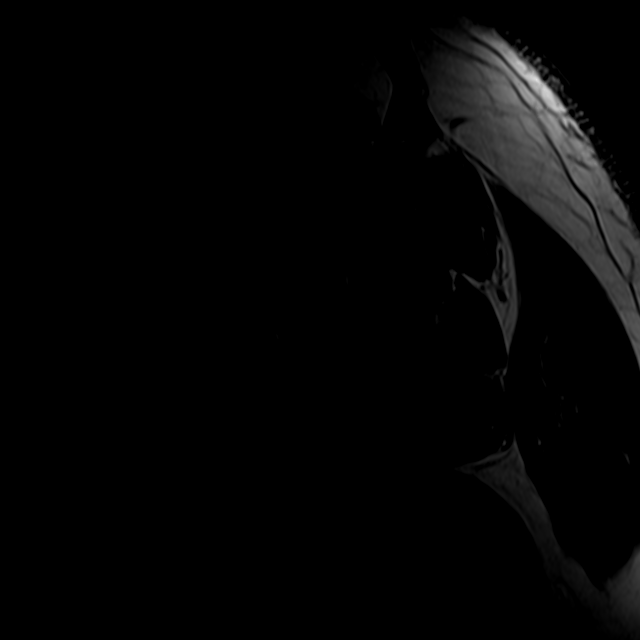
[im 6/20]
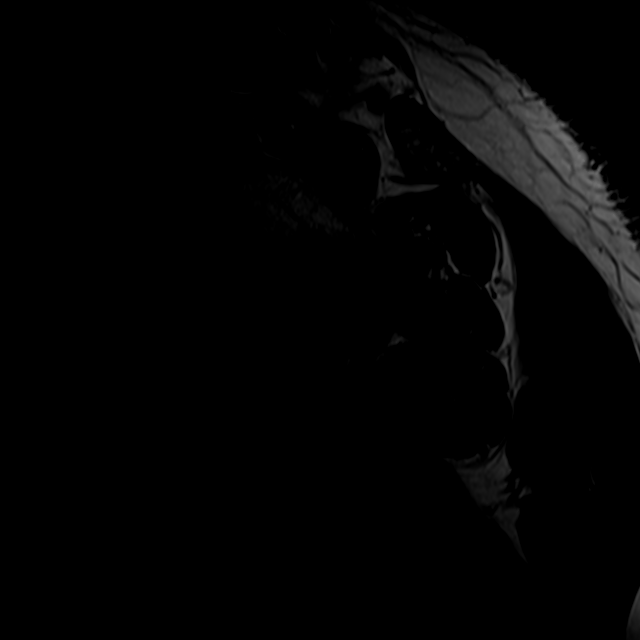

[35 of 40 positions shown; findings below may reference images not displayed]

FINDINGS: Rotator cuff: Significant rotator cuff tendinopathy/tendinosis.
Moderate intrasubstance tears are noted. There is also a articular
surface tear near the infraspinatus supraspinatus junction region
with 8 mm of laminar retraction of the articular fibers. More
anteriorly I suspect there is a small full-thickness perforation
involving the supraspinatus tendon with associated non focal fluid
in the subacromial/subdeltoid bursa. No retracted tear is
identified.

Muscles:  Normal

Biceps long head: Intact. Moderate tendinopathy involving the
intra-articular portion.

Acromioclavicular Joint: Severe degenerative changes with superior
and inferior spurring. Type 1-2 acromion. Mild lateral downsloping
and undersurface spurring.

Glenohumeral Joint: Mild degenerative changes. Small joint effusion.
There is thickening of the capsular structures in the axillary
recess which can be seen with adhesive capsulitis or synovitis.

Labrum:  Labral degenerative changes without obvious tear.

Bones:  No acute bony findings.

Other: Moderate fluid in the subacromial/subdeltoid bursa.
IMPRESSION: 1. Significant rotator cuff tendinopathy/tendinosis with
intrasubstance tears, partial-thickness articular surface tear and
probable small full-thickness perforation without retraction.
2. Long head biceps tendinopathy.  No tear/rupture.
3. Labral degenerative changes without obvious tear.
4. AC joint degenerative changes, mild lateral downsloping and
undersurface spurring from a type 1-2 acromion.

## 2020-02-11 DIAGNOSIS — M545 Low back pain: Secondary | ICD-10-CM | POA: Diagnosis not present

## 2020-02-15 DIAGNOSIS — I10 Essential (primary) hypertension: Secondary | ICD-10-CM | POA: Diagnosis not present

## 2020-02-15 DIAGNOSIS — M545 Low back pain: Secondary | ICD-10-CM | POA: Diagnosis not present

## 2020-02-15 DIAGNOSIS — Z79899 Other long term (current) drug therapy: Secondary | ICD-10-CM | POA: Diagnosis not present

## 2020-02-15 DIAGNOSIS — G8929 Other chronic pain: Secondary | ICD-10-CM | POA: Diagnosis not present

## 2020-03-17 DIAGNOSIS — M545 Low back pain: Secondary | ICD-10-CM | POA: Diagnosis not present

## 2020-03-17 DIAGNOSIS — Z79899 Other long term (current) drug therapy: Secondary | ICD-10-CM | POA: Diagnosis not present

## 2020-03-17 DIAGNOSIS — G8929 Other chronic pain: Secondary | ICD-10-CM | POA: Diagnosis not present

## 2020-03-19 DIAGNOSIS — M47814 Spondylosis without myelopathy or radiculopathy, thoracic region: Secondary | ICD-10-CM | POA: Diagnosis not present

## 2020-03-19 DIAGNOSIS — K219 Gastro-esophageal reflux disease without esophagitis: Secondary | ICD-10-CM | POA: Diagnosis not present

## 2020-03-19 DIAGNOSIS — M545 Low back pain: Secondary | ICD-10-CM | POA: Diagnosis not present

## 2020-03-19 DIAGNOSIS — G894 Chronic pain syndrome: Secondary | ICD-10-CM | POA: Diagnosis not present

## 2020-03-19 DIAGNOSIS — M5416 Radiculopathy, lumbar region: Secondary | ICD-10-CM | POA: Diagnosis not present

## 2020-03-19 DIAGNOSIS — Z125 Encounter for screening for malignant neoplasm of prostate: Secondary | ICD-10-CM | POA: Diagnosis not present

## 2020-03-19 DIAGNOSIS — E119 Type 2 diabetes mellitus without complications: Secondary | ICD-10-CM | POA: Diagnosis not present

## 2020-03-19 DIAGNOSIS — E782 Mixed hyperlipidemia: Secondary | ICD-10-CM | POA: Diagnosis not present

## 2020-03-19 DIAGNOSIS — F419 Anxiety disorder, unspecified: Secondary | ICD-10-CM | POA: Diagnosis not present

## 2020-03-19 DIAGNOSIS — I714 Abdominal aortic aneurysm, without rupture: Secondary | ICD-10-CM | POA: Diagnosis not present

## 2020-03-19 DIAGNOSIS — I10 Essential (primary) hypertension: Secondary | ICD-10-CM | POA: Diagnosis not present

## 2020-03-19 DIAGNOSIS — M47816 Spondylosis without myelopathy or radiculopathy, lumbar region: Secondary | ICD-10-CM | POA: Diagnosis not present

## 2020-03-24 DIAGNOSIS — J019 Acute sinusitis, unspecified: Secondary | ICD-10-CM | POA: Diagnosis not present

## 2020-03-24 DIAGNOSIS — J209 Acute bronchitis, unspecified: Secondary | ICD-10-CM | POA: Diagnosis not present

## 2020-04-17 DIAGNOSIS — Z79899 Other long term (current) drug therapy: Secondary | ICD-10-CM | POA: Diagnosis not present

## 2020-04-17 DIAGNOSIS — M545 Low back pain: Secondary | ICD-10-CM | POA: Diagnosis not present

## 2020-04-17 DIAGNOSIS — G8929 Other chronic pain: Secondary | ICD-10-CM | POA: Diagnosis not present

## 2020-04-17 DIAGNOSIS — R7303 Prediabetes: Secondary | ICD-10-CM | POA: Diagnosis not present

## 2020-05-16 DIAGNOSIS — M545 Low back pain: Secondary | ICD-10-CM | POA: Diagnosis not present

## 2020-05-16 DIAGNOSIS — R7303 Prediabetes: Secondary | ICD-10-CM | POA: Diagnosis not present

## 2020-05-16 DIAGNOSIS — G8929 Other chronic pain: Secondary | ICD-10-CM | POA: Diagnosis not present

## 2020-05-16 DIAGNOSIS — Z79899 Other long term (current) drug therapy: Secondary | ICD-10-CM | POA: Diagnosis not present

## 2020-06-16 DIAGNOSIS — G8929 Other chronic pain: Secondary | ICD-10-CM | POA: Diagnosis not present

## 2020-06-16 DIAGNOSIS — M545 Low back pain: Secondary | ICD-10-CM | POA: Diagnosis not present

## 2020-06-16 DIAGNOSIS — R7303 Prediabetes: Secondary | ICD-10-CM | POA: Diagnosis not present

## 2020-06-16 DIAGNOSIS — Z79899 Other long term (current) drug therapy: Secondary | ICD-10-CM | POA: Diagnosis not present

## 2020-07-17 DIAGNOSIS — E119 Type 2 diabetes mellitus without complications: Secondary | ICD-10-CM | POA: Diagnosis not present

## 2020-07-17 DIAGNOSIS — Z79899 Other long term (current) drug therapy: Secondary | ICD-10-CM | POA: Diagnosis not present

## 2020-07-17 DIAGNOSIS — M545 Low back pain: Secondary | ICD-10-CM | POA: Diagnosis not present

## 2020-07-17 DIAGNOSIS — G8929 Other chronic pain: Secondary | ICD-10-CM | POA: Diagnosis not present

## 2020-08-14 DIAGNOSIS — J0101 Acute recurrent maxillary sinusitis: Secondary | ICD-10-CM | POA: Diagnosis not present

## 2020-08-14 DIAGNOSIS — Z7189 Other specified counseling: Secondary | ICD-10-CM | POA: Diagnosis not present

## 2020-08-14 DIAGNOSIS — Z20828 Contact with and (suspected) exposure to other viral communicable diseases: Secondary | ICD-10-CM | POA: Diagnosis not present

## 2020-08-15 DIAGNOSIS — E119 Type 2 diabetes mellitus without complications: Secondary | ICD-10-CM | POA: Diagnosis not present

## 2020-08-15 DIAGNOSIS — M545 Low back pain, unspecified: Secondary | ICD-10-CM | POA: Diagnosis not present

## 2020-08-15 DIAGNOSIS — Z79899 Other long term (current) drug therapy: Secondary | ICD-10-CM | POA: Diagnosis not present

## 2020-08-15 DIAGNOSIS — G8929 Other chronic pain: Secondary | ICD-10-CM | POA: Diagnosis not present

## 2020-09-15 DIAGNOSIS — G8929 Other chronic pain: Secondary | ICD-10-CM | POA: Diagnosis not present

## 2020-09-15 DIAGNOSIS — Z79899 Other long term (current) drug therapy: Secondary | ICD-10-CM | POA: Diagnosis not present

## 2020-09-15 DIAGNOSIS — M545 Low back pain, unspecified: Secondary | ICD-10-CM | POA: Diagnosis not present

## 2020-09-15 DIAGNOSIS — E119 Type 2 diabetes mellitus without complications: Secondary | ICD-10-CM | POA: Diagnosis not present

## 2020-10-13 DIAGNOSIS — Z789 Other specified health status: Secondary | ICD-10-CM | POA: Diagnosis not present

## 2020-10-13 DIAGNOSIS — E119 Type 2 diabetes mellitus without complications: Secondary | ICD-10-CM | POA: Diagnosis not present

## 2020-10-13 DIAGNOSIS — Z1211 Encounter for screening for malignant neoplasm of colon: Secondary | ICD-10-CM | POA: Diagnosis not present

## 2020-10-13 DIAGNOSIS — Z79899 Other long term (current) drug therapy: Secondary | ICD-10-CM | POA: Diagnosis not present

## 2020-10-15 DIAGNOSIS — Z23 Encounter for immunization: Secondary | ICD-10-CM | POA: Diagnosis not present

## 2020-10-15 DIAGNOSIS — M25512 Pain in left shoulder: Secondary | ICD-10-CM | POA: Diagnosis not present

## 2020-10-15 DIAGNOSIS — Z20828 Contact with and (suspected) exposure to other viral communicable diseases: Secondary | ICD-10-CM | POA: Diagnosis not present

## 2020-11-03 DIAGNOSIS — M47814 Spondylosis without myelopathy or radiculopathy, thoracic region: Secondary | ICD-10-CM | POA: Diagnosis not present

## 2020-11-03 DIAGNOSIS — G894 Chronic pain syndrome: Secondary | ICD-10-CM | POA: Diagnosis not present

## 2020-11-03 DIAGNOSIS — I714 Abdominal aortic aneurysm, without rupture: Secondary | ICD-10-CM | POA: Diagnosis not present

## 2020-11-03 DIAGNOSIS — M47816 Spondylosis without myelopathy or radiculopathy, lumbar region: Secondary | ICD-10-CM | POA: Diagnosis not present

## 2020-11-03 DIAGNOSIS — F419 Anxiety disorder, unspecified: Secondary | ICD-10-CM | POA: Diagnosis not present

## 2020-11-03 DIAGNOSIS — M5416 Radiculopathy, lumbar region: Secondary | ICD-10-CM | POA: Diagnosis not present

## 2020-11-03 DIAGNOSIS — E782 Mixed hyperlipidemia: Secondary | ICD-10-CM | POA: Diagnosis not present

## 2020-11-03 DIAGNOSIS — I10 Essential (primary) hypertension: Secondary | ICD-10-CM | POA: Diagnosis not present

## 2020-11-13 DIAGNOSIS — G8929 Other chronic pain: Secondary | ICD-10-CM | POA: Diagnosis not present

## 2020-11-13 DIAGNOSIS — Z23 Encounter for immunization: Secondary | ICD-10-CM | POA: Diagnosis not present

## 2020-11-13 DIAGNOSIS — E119 Type 2 diabetes mellitus without complications: Secondary | ICD-10-CM | POA: Diagnosis not present

## 2020-11-13 DIAGNOSIS — M545 Low back pain, unspecified: Secondary | ICD-10-CM | POA: Diagnosis not present

## 2020-11-13 DIAGNOSIS — Z79899 Other long term (current) drug therapy: Secondary | ICD-10-CM | POA: Diagnosis not present

## 2020-12-12 DIAGNOSIS — K1120 Sialoadenitis, unspecified: Secondary | ICD-10-CM | POA: Diagnosis not present

## 2020-12-15 DIAGNOSIS — Z79899 Other long term (current) drug therapy: Secondary | ICD-10-CM | POA: Diagnosis not present

## 2020-12-15 DIAGNOSIS — E119 Type 2 diabetes mellitus without complications: Secondary | ICD-10-CM | POA: Diagnosis not present

## 2020-12-15 DIAGNOSIS — G8929 Other chronic pain: Secondary | ICD-10-CM | POA: Diagnosis not present

## 2020-12-15 DIAGNOSIS — I10 Essential (primary) hypertension: Secondary | ICD-10-CM | POA: Diagnosis not present

## 2020-12-15 DIAGNOSIS — M545 Low back pain, unspecified: Secondary | ICD-10-CM | POA: Diagnosis not present

## 2020-12-22 DIAGNOSIS — M26629 Arthralgia of temporomandibular joint, unspecified side: Secondary | ICD-10-CM | POA: Diagnosis not present

## 2021-01-02 DIAGNOSIS — R059 Cough, unspecified: Secondary | ICD-10-CM | POA: Diagnosis not present

## 2021-01-02 DIAGNOSIS — J449 Chronic obstructive pulmonary disease, unspecified: Secondary | ICD-10-CM | POA: Diagnosis not present

## 2021-01-02 DIAGNOSIS — R6884 Jaw pain: Secondary | ICD-10-CM | POA: Diagnosis not present

## 2021-01-12 DIAGNOSIS — F17210 Nicotine dependence, cigarettes, uncomplicated: Secondary | ICD-10-CM | POA: Diagnosis not present

## 2021-01-12 DIAGNOSIS — G8929 Other chronic pain: Secondary | ICD-10-CM | POA: Diagnosis not present

## 2021-01-12 DIAGNOSIS — M545 Low back pain, unspecified: Secondary | ICD-10-CM | POA: Diagnosis not present

## 2021-01-12 DIAGNOSIS — E119 Type 2 diabetes mellitus without complications: Secondary | ICD-10-CM | POA: Diagnosis not present

## 2021-01-12 DIAGNOSIS — Z79899 Other long term (current) drug therapy: Secondary | ICD-10-CM | POA: Diagnosis not present

## 2021-01-12 NOTE — Progress Notes (Signed)
Cardiology Office Note  Date: 01/13/2021   ID: Gerald Mccann, DOB 06-Aug-1961, MRN [id number]  PCP:  Lovey Newcomer, PA  Cardiologist:  Prentice Docker, MD (Inactive) Electrophysiologist:  None   Chief Complaint: Cardiac follow up  History of Present Illness: Gerald Mccann is a 60 y.o. male with a history of aortic stenosis, hypertension, aortic root dilatation, tobacco abuse, atypical chest pain.  Last visit with Dr. Purvis Sheffield 06/19/2019 via telemedicine visit.  His chronic shortness of breath from COPD was stable.  He denied any edema or syncope.  Denied any chest pain.  Echocardiogram 03/10/2017 normal LV function with EF of 60 to 65%, moderate LVH, mild to moderate aortic stenosis, mild aortic root and proximal ascending aortic dilatation at 37 mm.  A follow-up echocardiogram was ordered.  Patient is here for 1 year follow-up.  He denies any recent acute illnesses or hospitalizations.  Denies any anginal or exertional symptoms.  However he does have COPD.  He continues to smoke.  Denies any lightheadedness dizziness, syncope, near syncope, CVA or TIA-like symptoms, palpitations or arrhythmias, PND, orthopnea, bleeding.  Denies any claudication-like symptoms, DVT or PE-like symptoms.  States he and his wife have talked about stopping smoking recently.   Past Medical History:  Diagnosis Date  . Arthrosis of left acromioclavicular joint   . Chronic back pain   . Complete rotator cuff tear of left shoulder   . Current every day smoker    smokes a pack and a half a day  . Hiatal hernia   . HTN (hypertension)   . Impingement syndrome of left shoulder   . Pain management contract discussed    post op pain meds to be managed by [facility] who currently manages his pain meds    Past Surgical History:  Procedure Laterality Date  . right knee surgery      Current Outpatient Medications  Medication Sig Dispense Refill  . amLODipine (NORVASC) 10 MG tablet TAKE  ONE TABLET BY MOUTH DAILY. 30 tablet 0  . aspirin 81 MG tablet Take 81 mg by mouth daily.    Marland Kitchen atorvastatin (LIPITOR) 20 MG tablet Take 1 tablet by mouth daily.    . Blood Pressure Monitor MISC Use as directed 1 each 0  . busPIRone (BUSPAR) 5 MG tablet Take 5 mg by mouth 2 (two) times daily as needed. for anxiety  2  . carvedilol (COREG) 3.125 MG tablet Take 3.125 mg by mouth 2 (two) times daily with a meal.    . citalopram (CELEXA) 40 MG tablet Take 40 mg by mouth daily.  0  . cyclobenzaprine (FLEXERIL) 10 MG tablet Take 10 mg by mouth 2 (two) times daily.    . diclofenac sodium (VOLTAREN) 1 % GEL Apply 1 application topically 4 (four) times daily as needed.    . fluticasone (FLONASE) 50 MCG/ACT nasal spray Place 2 sprays into both nostrils daily.    Marland Kitchen gabapentin (NEURONTIN) 300 MG capsule Take 300 mg by mouth at bedtime.    . methocarbamol (ROBAXIN) 750 MG tablet Take 1 tablet by mouth 2 (two) times daily as needed.    Marland Kitchen olmesartan (BENICAR) 20 MG tablet Take 1 tablet by mouth daily.    Marland Kitchen omeprazole (PRILOSEC) 40 MG capsule Take 1 capsule by mouth daily.    Marland Kitchen oxyCODONE-acetaminophen (PERCOCET) 10-325 MG tablet TAKE ONE TABLET BY MOUTH EVERY 4 TO 6 HOURS AS NEEDED.  0  . VENTOLIN HFA 108 (90 Base) MCG/ACT inhaler  Inhale 2 puffs into the lungs every 4 (four) hours as needed.     No current facility-administered medications for this visit.   Allergies:  Patient has no known allergies.   Social History: The patient  reports that he has been smoking cigarettes. He started smoking about 48 years ago. He has been smoking about 1.50 packs per day. He has never used smokeless tobacco. He reports current alcohol use. He reports that he does not use drugs.   Family History: The patient's family history includes Asthma in an other family member; Coronary artery disease in his father and mother; Heart attack in his father; Heart attack (age of onset: 34) in his brother.   ROS:  Please see the history  of present illness. Otherwise, complete review of systems is positive for none.  All other systems are reviewed and negative.   Physical Exam: VS:  BP (!) 152/88   Pulse 71   Ht 6\' 1"  (1.854 m)   Wt 247 lb (112 kg)   SpO2 97%   BMI 32.59 kg/m , BMI Body mass index is 32.59 kg/m.  Wt Readings from Last 3 Encounters:  01/13/21 247 lb (112 kg)  06/19/19 250 lb (113.4 kg)  04/10/18 245 lb (111.1 kg)    General: Patient appears comfortable at rest. Neck: Supple, no elevated JVP or carotid bruits, no thyromegaly. Lungs: Clear to auscultation, nonlabored breathing at rest. Cardiac: Regular rate and rhythm, no S3 or significant systolic murmur, no pericardial rub. Extremities: No pitting edema, distal pulses 2+. Skin: Warm and dry. Musculoskeletal: No kyphosis. Neuropsychiatric: Alert and oriented x3, affect grossly appropriate.  ECG:  An ECG dated 01/13/2021 was personally reviewed today and demonstrated:  Normal sinus rhythm rate of 67.  Recent Labwork: No results found for requested labs within last 8760 hours.  No results found for: CHOL, TRIG, HDL, CHOLHDL, VLDL, LDLCALC, LDLDIRECT  Other Studies Reviewed Today:  Echocardiogram 07/11/2019  1. The left ventricle has normal systolic function with an ejection fraction of 60-65%. The cavity size was normal. There is mild concentric left ventricular hypertrophy. Left ventricular diastolic Doppler parameters are consistent with impaired relaxation. 2. The right ventricle has normal systolic function. The cavity was normal. There is no increase in right ventricular wall thickness. 3. The aortic valve was not well visualized. Mild thickening of the aortic valve. Mild calcification of the aortic valve. Mild aortic stenosis. Aortic valve regurgitation is mild by color flow Doppler. Mild aortic annular calcification noted. 4. The mitral valve is grossly normal. 5. The tricuspid valve is grossly normal. 6. Poor aortic valve  visualization. 7. The aorta is abnormal unless otherwise noted. 8. There is mild dilatation of the aortic root measuring 39 mm.  Carotid Dopplers on 06/07/2018 showed 1 to 39% bilateral internal carotid artery stenosis.  Assessment and Plan:  1. Aortic valve stenosis, etiology of cardiac valve disease unspecified   2. Aortic root dilatation (HCC)   3. Essential hypertension, benign   4. Tobacco abuse   5. Mixed hyperlipidemia    1. Aortic valve stenosis, etiology of cardiac valve disease unspecified Has an audible 2/6 murmur best heard right upper sternal border.  History of mild aortic stenosis/insufficiency on previous echo.  Please get a repeat echocardiogram.  2. Aortic root dilatation (HCC) Previous echo showed mild dilatation of aortic root measuring 39 mm.  We are repeating echocardiogram for reevaluation.  3. Essential hypertension, benign Blood pressure elevated initially on arrival 152/88.  Recheck in left arm was  130/80.  Continue amlodipine 10 mg daily.  Continue Coreg 3.125 mg p.o. twice daily.  Continue olmesartan 20 mg daily.  4. Tobacco abuse States she continues to smoke and he knows he has COPD.  States he has talked to his wife about both of them trying to quit.  States he knows he needs to quit.  Highly advised cessation.  5.  Hyperlipidemia Continue atorvastatin 20 mg daily.  Continue aspirin 81 mg daily.  Medication Adjustments/Labs and Tests Ordered: Current medicines are reviewed at length with the patient today.  Concerns regarding medicines are outlined above.   Disposition: Follow-up with Dr. Wyline Mood or APP 4 to 6 weeks.  Signed, Rennis Harding, NP 01/13/2021 8:23 AM    [facility] at [facility] [address] [place], [place], [place] [address] Phone: [phone]; Fax: [phone]

## 2021-01-13 ENCOUNTER — Encounter: Payer: Self-pay | Admitting: Family Medicine

## 2021-01-13 ENCOUNTER — Ambulatory Visit (INDEPENDENT_AMBULATORY_CARE_PROVIDER_SITE_OTHER): Payer: Medicare Other | Admitting: Family Medicine

## 2021-01-13 VITALS — BP 152/88 | HR 71 | Ht 73.0 in | Wt 247.0 lb

## 2021-01-13 DIAGNOSIS — I10 Essential (primary) hypertension: Secondary | ICD-10-CM | POA: Diagnosis not present

## 2021-01-13 DIAGNOSIS — Z720 Tobacco use: Secondary | ICD-10-CM | POA: Diagnosis not present

## 2021-01-13 DIAGNOSIS — E782 Mixed hyperlipidemia: Secondary | ICD-10-CM

## 2021-01-13 DIAGNOSIS — I350 Nonrheumatic aortic (valve) stenosis: Secondary | ICD-10-CM

## 2021-01-13 DIAGNOSIS — I77810 Thoracic aortic ectasia: Secondary | ICD-10-CM

## 2021-01-13 NOTE — Patient Instructions (Signed)
Your physician recommends that you schedule a follow-up appointment in: 4-6 WEEKS WITH ANDY QUINN, NP  Your physician recommends that you continue on your current medications as directed. Please refer to the Current Medication list given to you today.  Your physician has requested that you have an echocardiogram. Echocardiography is a painless test that uses sound waves to create images of your heart. It provides your doctor with information about the size and shape of your heart and how well your heart's chambers and valves are working. This procedure takes approximately one hour. There are no restrictions for this procedure.  Thank you for choosing [facility]!!    

## 2021-01-29 ENCOUNTER — Ambulatory Visit (INDEPENDENT_AMBULATORY_CARE_PROVIDER_SITE_OTHER): Payer: Medicare Other

## 2021-01-29 ENCOUNTER — Other Ambulatory Visit: Payer: Self-pay

## 2021-01-29 DIAGNOSIS — I350 Nonrheumatic aortic (valve) stenosis: Secondary | ICD-10-CM | POA: Diagnosis not present

## 2021-01-30 ENCOUNTER — Telehealth: Payer: Self-pay | Admitting: *Deleted

## 2021-01-30 LAB — ECHOCARDIOGRAM COMPLETE
AR max vel: 1.24 cm2
AV Area VTI: 1.32 cm2
AV Area mean vel: 1.17 cm2
AV Mean grad: 22 mmHg
AV Peak grad: 41.2 mmHg
Ao pk vel: 3.21 m/s
Area-P 1/2: 1.75 cm2
Calc EF: 60.1 %
S' Lateral: 2.72 cm
Single Plane A2C EF: 59.5 %
Single Plane A4C EF: 59.7 %

## 2021-01-30 NOTE — Telephone Encounter (Signed)
Lesle Chris, LPN  06/09/1693 5:03 PM EDT Back to Top     Notified, copy to pcp.    Lesle Chris, LPN  06/09/8279 0:34 PM EDT      Left message to return call.   Netta Neat., NP  01/30/2021 3:34 PM EDT      Please call the patient let him know the echocardiogram showed the pumping function of his heart is good. The main pumping chamber of the heart is a little stiff and has problems relaxing when trying to to fill with blood. He has some moderate narrowing of the aortic valve. It was considered mild to moderate back in 2018 so basically unchanged.

## 2021-02-06 DIAGNOSIS — I10 Essential (primary) hypertension: Secondary | ICD-10-CM | POA: Diagnosis not present

## 2021-02-06 DIAGNOSIS — E119 Type 2 diabetes mellitus without complications: Secondary | ICD-10-CM | POA: Diagnosis not present

## 2021-02-06 DIAGNOSIS — Z79899 Other long term (current) drug therapy: Secondary | ICD-10-CM | POA: Diagnosis not present

## 2021-02-09 NOTE — Progress Notes (Signed)
Cardiology Office Note  Date: 02/10/2021   ID: GOR VESTAL, DOB February 15, 1961, MRN [id number]  PCP:  Lovey Newcomer, PA  Cardiologist:  No primary care provider on file. Electrophysiologist:  None   Chief Complaint: Cardiac follow up  History of Present Illness: BRYAM TABORDA is a 60 y.o. male with a history of aortic stenosis, hypertension, aortic root dilatation, tobacco abuse, atypical chest pain.  Last visit with Dr. Purvis Sheffield 06/19/2019 via telemedicine visit.  His chronic shortness of breath from COPD was stable.  He denied any edema or syncope.  Denied any chest pain.  Echocardiogram 03/10/2017 normal LV function with EF of 60 to 65%, moderate LVH, mild to moderate aortic stenosis, mild aortic root and proximal ascending aortic dilatation at 37 mm.  A follow-up echocardiogram was ordered.  Patient is here for 1 year follow-up.  He denies any recent acute illnesses or hospitalizations.  Denies any anginal or exertional symptoms.  However he does have COPD.  He continues to smoke.  Denies any lightheadedness dizziness, syncope, near syncope, CVA or TIA-like symptoms, palpitations or arrhythmias, PND, orthopnea, bleeding.  Denies any claudication-like symptoms, DVT or PE-like symptoms.  States he and his wife have talked about stopping smoking recently.   Past Medical History:  Diagnosis Date  . Arthrosis of left acromioclavicular joint   . Chronic back pain   . Complete rotator cuff tear of left shoulder   . Current every day smoker    smokes a pack and a half a day  . Hiatal hernia   . HTN (hypertension)   . Impingement syndrome of left shoulder   . Pain management contract discussed    post op pain meds to be managed by [facility] who currently manages his pain meds    Past Surgical History:  Procedure Laterality Date  . right knee surgery      Current Outpatient Medications  Medication Sig Dispense Refill  . amLODipine (NORVASC) 10 MG tablet TAKE  ONE TABLET BY MOUTH DAILY. 30 tablet 0  . aspirin 81 MG tablet Take 81 mg by mouth daily.    Marland Kitchen atorvastatin (LIPITOR) 20 MG tablet Take 1 tablet by mouth daily.    . Blood Pressure Monitor MISC Use as directed 1 each 0  . busPIRone (BUSPAR) 5 MG tablet Take 5 mg by mouth 2 (two) times daily as needed. for anxiety  2  . carvedilol (COREG) 3.125 MG tablet Take 3.125 mg by mouth 2 (two) times daily with a meal.    . citalopram (CELEXA) 40 MG tablet Take 40 mg by mouth daily.  0  . cyclobenzaprine (FLEXERIL) 10 MG tablet Take 10 mg by mouth 2 (two) times daily.    . diclofenac sodium (VOLTAREN) 1 % GEL Apply 1 application topically 4 (four) times daily as needed.    . fluticasone (FLONASE) 50 MCG/ACT nasal spray Place 2 sprays into both nostrils daily.    Marland Kitchen gabapentin (NEURONTIN) 300 MG capsule Take 300 mg by mouth at bedtime.    . metFORMIN (GLUCOPHAGE-XR) 500 MG 24 hr tablet Take 1,000 mg by mouth daily.    . methocarbamol (ROBAXIN) 750 MG tablet Take 1 tablet by mouth 2 (two) times daily as needed.    Marland Kitchen olmesartan (BENICAR) 20 MG tablet Take 1 tablet by mouth daily.    Marland Kitchen omeprazole (PRILOSEC) 40 MG capsule Take 1 capsule by mouth daily.    Marland Kitchen oxyCODONE-acetaminophen (PERCOCET) 10-325 MG tablet TAKE ONE TABLET BY  MOUTH EVERY 4 TO 6 HOURS AS NEEDED.  0  . VENTOLIN HFA 108 (90 Base) MCG/ACT inhaler Inhale 2 puffs into the lungs every 4 (four) hours as needed.     No current facility-administered medications for this visit.   Allergies:  Patient has no known allergies.   Social History: The patient  reports that he has been smoking cigarettes. He started smoking about 48 years ago. He has been smoking about 1.50 packs per day. He has never used smokeless tobacco. He reports current alcohol use. He reports that he does not use drugs.   Family History: The patient's family history includes Asthma in an other family member; Coronary artery disease in his father and mother; Heart attack in his father;  Heart attack (age of onset: 61) in his brother.   ROS:  Please see the history of present illness. Otherwise, complete review of systems is positive for none.  All other systems are reviewed and negative.   Physical Exam: VS:  BP 130/80 (BP Location: Left Arm, Cuff Size: Large)   Pulse 68   Ht 6\' 1"  (1.854 m)   Wt 250 lb 9.6 oz (113.7 kg)   SpO2 98%   BMI 33.06 kg/m , BMI Body mass index is 33.06 kg/m.  Wt Readings from Last 3 Encounters:  02/10/21 250 lb 9.6 oz (113.7 kg)  01/13/21 247 lb (112 kg)  06/19/19 250 lb (113.4 kg)    General: Patient appears comfortable at rest. Neck: Supple, no elevated JVP or carotid bruits, no thyromegaly. Lungs: Clear to auscultation, nonlabored breathing at rest. Cardiac: Regular rate and rhythm, no S3 or significant systolic murmur, no pericardial rub. Extremities: No pitting edema, distal pulses 2+. Skin: Warm and dry. Musculoskeletal: No kyphosis. Neuropsychiatric: Alert and oriented x3, affect grossly appropriate.  ECG:  An ECG dated 01/13/2021 was personally reviewed today and demonstrated:  Normal sinus rhythm rate of 67.  Recent Labwork: No results found for requested labs within last 8760 hours.  No results found for: CHOL, TRIG, HDL, CHOLHDL, VLDL, LDLCALC, LDLDIRECT  Other Studies Reviewed Today:   Echocardiogram 01/29/2021 1. Left ventricular ejection fraction, by estimation, is 65 to 70%. The left ventricle has normal function. The left ventricle has no regional wall motion abnormalities. Left ventricular diastolic parameters are consistent with Grade I diastolic dysfunction (impaired relaxation). The average left ventricular global longitudinal strain is -17.2 %. The global longitudinal strain is normal. 2. Right ventricular systolic function is normal. The right ventricular size is normal. 3. The mitral valve is normal in structure. No evidence of mitral valve regurgitation. No evidence of mitral stenosis. 4. The aortic valve  is tricuspid. There is moderate calcification of the aortic valve. There is moderate thickening of the aortic valve. Aortic valve regurgitation is not visualized. Moderate aortic valve stenosis. Aortic valve mean gradient measures 22.0 mmHg. Aortic valve peak gradient measures 41.2 mmHg. Aortic valve area, by VTI measures 1.32 cm. 5. The inferior vena cava is normal in size with greater than 50% respiratory variability, suggesting right atrial pressure of 3 mmHg. Comparison(s): Echocardiogram done 07/11/19 showed an EF of 60-65% with mild AS and an AV Peak Grad of 30.6 mm Hg.    Echocardiogram 07/11/2019  1. The left ventricle has normal systolic function with an ejection fraction of 60-65%. The cavity size was normal. There is mild concentric left ventricular hypertrophy. Left ventricular diastolic Doppler parameters are consistent with impaired relaxation. 2. The right ventricle has normal systolic function. The cavity was  normal. There is no increase in right ventricular wall thickness. 3. The aortic valve was not well visualized. Mild thickening of the aortic valve. Mild calcification of the aortic valve. Mild aortic stenosis. Aortic valve regurgitation is mild by color flow Doppler. Mild aortic annular calcification noted. 4. The mitral valve is grossly normal. 5. The tricuspid valve is grossly normal. 6. Poor aortic valve visualization. 7. The aorta is abnormal unless otherwise noted. 8. There is mild dilatation of the aortic root measuring 39 mm.  Carotid Dopplers on 06/07/2018 showed 1 to 39% bilateral internal carotid artery stenosis.  Assessment and Plan:  1. Aortic valve stenosis, etiology of cardiac valve disease unspecified   2. Aortic root dilatation (HCC)   3. Essential hypertension, benign   4. Tobacco abuse   5. Mixed hyperlipidemia   6. Pain in both lower extremities    1. Aortic valve stenosis, etiology of cardiac valve disease unspecified Has an audible 2/6  murmur best heard right upper sternal border.  History of mild aortic stenosis/insufficiency on previous echo. Recent echocardiogram on 01/29/2021 demonstrated EF of 65 to 70%.  No WMA's.  G1 DD.  Moderate aortic valve stenosis.  Mean gradient 22 mmHg.  Aortic valve peak gradient 41.2 mmHg.  Aortic valve area by VTI 1.32 cm.  Patient voices no significant shortness of breath, orthostatic symptoms, or anginal symptoms.  2. Aortic root dilatation (HCC) Previous echo showed mild dilatation of aortic root measuring 39 mm.  We are repeating echocardiogram for reevaluation.  Recent echocardiogram on 01/29/2021 demonstrated no evidence of aortic dilatation.  3. Essential hypertension, benign Blood pressure today is 130/80.  Continue amlodipine 10 mg daily.  Continue Coreg 3.125 mg p.o. twice daily.  Continue olmesartan 20 mg daily.  4. Tobacco abuse States he continues to smoke and he knows he has COPD.  States he has talked to his wife about both of them trying to quit.  States he knows he needs to quit.  Highly advised cessation.  Continue to reinforce cessation of smoking today.  5.  Hyperlipidemia Continue atorvastatin 20 mg daily.  Continue aspirin 81 mg daily.  6.  Leg pain/claudication-like symptoms Complaining of bilateral lower leg pain left greater than right when ambulating and improved with rest.  Please get a lower arterial duplex study to assess for PAD  Medication Adjustments/Labs and Tests Ordered: Current medicines are reviewed at length with the patient today.  Concerns regarding medicines are outlined above.   Disposition: Follow-up with Dr. Wyline Mood or APP 6 months.  Signed, Rennis Harding, NP 02/10/2021 8:22 AM    [facility] at [facility] [address] [place], [place], [place] [address] Phone: [phone]; Fax: [phone]

## 2021-02-10 ENCOUNTER — Ambulatory Visit (INDEPENDENT_AMBULATORY_CARE_PROVIDER_SITE_OTHER): Payer: Medicare Other | Admitting: Family Medicine

## 2021-02-10 ENCOUNTER — Encounter: Payer: Self-pay | Admitting: Family Medicine

## 2021-02-10 VITALS — BP 130/80 | HR 68 | Ht 73.0 in | Wt 250.6 lb

## 2021-02-10 DIAGNOSIS — E782 Mixed hyperlipidemia: Secondary | ICD-10-CM

## 2021-02-10 DIAGNOSIS — I350 Nonrheumatic aortic (valve) stenosis: Secondary | ICD-10-CM

## 2021-02-10 DIAGNOSIS — I77810 Thoracic aortic ectasia: Secondary | ICD-10-CM | POA: Diagnosis not present

## 2021-02-10 DIAGNOSIS — M79604 Pain in right leg: Secondary | ICD-10-CM | POA: Diagnosis not present

## 2021-02-10 DIAGNOSIS — M79605 Pain in left leg: Secondary | ICD-10-CM | POA: Diagnosis not present

## 2021-02-10 DIAGNOSIS — Z720 Tobacco use: Secondary | ICD-10-CM | POA: Diagnosis not present

## 2021-02-10 DIAGNOSIS — I10 Essential (primary) hypertension: Secondary | ICD-10-CM | POA: Diagnosis not present

## 2021-02-10 NOTE — Patient Instructions (Signed)
Your physician recommends that you schedule a follow-up appointment in: 6 MONTHS WITH DR [facility]  Your physician recommends that you continue on your current medications as directed. Please refer to the Current Medication list given to you today.  Your physician has requested that you have a lower extremity arterial duplex. This test is an ultrasound of the arteries in the legs or arms. It looks at arterial blood flow in the legs and arms. Allow one hour for Lower and Upper Arterial scans. There are no restrictions or special instructions  Thank you for choosing [facility]!!

## 2021-02-19 ENCOUNTER — Other Ambulatory Visit: Payer: Self-pay | Admitting: Family Medicine

## 2021-02-19 DIAGNOSIS — I739 Peripheral vascular disease, unspecified: Secondary | ICD-10-CM

## 2021-03-09 DIAGNOSIS — M545 Low back pain, unspecified: Secondary | ICD-10-CM | POA: Diagnosis not present

## 2021-03-09 DIAGNOSIS — E119 Type 2 diabetes mellitus without complications: Secondary | ICD-10-CM | POA: Diagnosis not present

## 2021-03-09 DIAGNOSIS — G8929 Other chronic pain: Secondary | ICD-10-CM | POA: Diagnosis not present

## 2021-03-19 ENCOUNTER — Ambulatory Visit (INDEPENDENT_AMBULATORY_CARE_PROVIDER_SITE_OTHER): Payer: Medicare Other

## 2021-03-19 DIAGNOSIS — I739 Peripheral vascular disease, unspecified: Secondary | ICD-10-CM

## 2021-03-24 ENCOUNTER — Telehealth: Payer: Self-pay | Admitting: Family Medicine

## 2021-03-24 DIAGNOSIS — M79605 Pain in left leg: Secondary | ICD-10-CM

## 2021-03-24 DIAGNOSIS — I70209 Unspecified atherosclerosis of native arteries of extremities, unspecified extremity: Secondary | ICD-10-CM

## 2021-03-24 DIAGNOSIS — M79604 Pain in right leg: Secondary | ICD-10-CM

## 2021-03-24 NOTE — Telephone Encounter (Signed)
Lesle Chris, LPN  1/68/3729 0:21 PM EDT Back to Top     Notified, copy to pcp. Agreeable to referral.

## 2021-03-24 NOTE — Telephone Encounter (Signed)
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 The left leg shows no obstructive disease. The right ankle-brachial index is 1.23 which means blood flow through that extremity is good The left side shows a 0.82 which means there is some decreased blood flow to that side which would be considered mild. Tell him we will refer him to vein and vascular to evaluate the occluded anterior tibial artery and his right leg. Thank you. Go ahead and make referral to vein and vascular. Thank you

## 2021-03-24 NOTE — Telephone Encounter (Signed)
Patient called requesting test results of recent Vas Study.

## 2021-04-16 DIAGNOSIS — Z79899 Other long term (current) drug therapy: Secondary | ICD-10-CM | POA: Diagnosis not present

## 2021-04-16 DIAGNOSIS — G8929 Other chronic pain: Secondary | ICD-10-CM | POA: Diagnosis not present

## 2021-04-16 DIAGNOSIS — E119 Type 2 diabetes mellitus without complications: Secondary | ICD-10-CM | POA: Diagnosis not present

## 2021-04-16 DIAGNOSIS — I10 Essential (primary) hypertension: Secondary | ICD-10-CM | POA: Diagnosis not present

## 2021-04-16 DIAGNOSIS — M545 Low back pain, unspecified: Secondary | ICD-10-CM | POA: Diagnosis not present

## 2021-04-27 ENCOUNTER — Other Ambulatory Visit: Payer: Self-pay

## 2021-04-27 ENCOUNTER — Ambulatory Visit (INDEPENDENT_AMBULATORY_CARE_PROVIDER_SITE_OTHER): Payer: Medicare Other | Admitting: Vascular Surgery

## 2021-04-27 ENCOUNTER — Encounter: Payer: Self-pay | Admitting: Vascular Surgery

## 2021-04-27 VITALS — BP 135/82 | HR 63 | Temp 97.3°F | Resp 18 | Ht 72.0 in | Wt 252.0 lb

## 2021-04-27 DIAGNOSIS — I70212 Atherosclerosis of native arteries of extremities with intermittent claudication, left leg: Secondary | ICD-10-CM

## 2021-04-27 DIAGNOSIS — I70209 Unspecified atherosclerosis of native arteries of extremities, unspecified extremity: Secondary | ICD-10-CM

## 2021-04-27 NOTE — Progress Notes (Signed)
[facility] [place]  Patient name: Gerald Mccann MRN: [id number] DOB: 09-07-1961 Sex: male  REASON FOR CONSULT: Evaluate for peripheral vascular occlusive disease and left leg intermittent claudication  HPI: Gerald Mccann is a 60 y.o. male, who is here today for discussion of his peripheral vascular occlusive disease.  He reports that he has classic calf claudication type symptoms with walking.  This is mildly limiting to him.  He reports that he does not experience this in his routine activities but if his walking more than average he will notice discomfort.  He does report some shortness of breath due to pulmonary dysfunction that also limits him.  He denies any arterial rest pain and has had no tissue loss.  He has a long history of extensive cigarette smoking.  He denies cardiac disease.  Past Medical History:  Diagnosis Date   Arthrosis of left acromioclavicular joint    Chronic back pain    Complete rotator cuff tear of left shoulder    Current every day smoker    smokes a pack and a half a day   Hiatal hernia    HTN (hypertension)    Impingement syndrome of left shoulder    Pain management contract discussed    post op pain meds to be managed by [facility] who currently manages his pain meds    Family History  Problem Relation Age of Onset   Coronary artery disease Mother    Coronary artery disease Father    Heart attack Father    Heart attack Brother 69       deceased   Asthma Other     SOCIAL HISTORY: Social History   Socioeconomic History   Marital status: Married    Spouse name: Not on file   Number of children: 5   Years of education: Not on file   Highest education level: Not on file  Occupational History   Not on file  Tobacco Use   Smoking status: Every Day    Packs/day: 1.50    Pack years: 0.00    Types: Cigarettes    Start date: 09/01/1972   Smokeless tobacco: Never   Vaping Use   Vaping Use: Never used  Substance and Sexual Activity   Alcohol use: Yes    Alcohol/week: 0.0 standard drinks    Comment: 1 to 2 beers q month   Drug use: No   Sexual activity: Not on file  Other Topics Concern   Not on file  Social History Narrative   Not on file   Social Determinants of Health   Financial Resource Strain: Not on file  Food Insecurity: Not on file  Transportation Needs: Not on file  Physical Activity: Not on file  Stress: Not on file  Social Connections: Not on file  Intimate Partner Violence: Not on file    No Known Allergies  Current Outpatient Medications  Medication Sig Dispense Refill   amLODipine (NORVASC) 10 MG tablet TAKE ONE TABLET BY MOUTH DAILY. 30 tablet 0   aspirin 81 MG tablet Take 81 mg by mouth daily.     atorvastatin (LIPITOR) 20 MG tablet Take 1 tablet by mouth daily.     Blood Pressure Monitor MISC Use as directed 1 each 0   busPIRone (BUSPAR) 5 MG tablet Take 5 mg by mouth 2 (two) times daily as needed. for anxiety  2   carvedilol (COREG) 3.125 MG tablet Take 3.125 mg by mouth 2 (  two) times daily with a meal.     citalopram (CELEXA) 40 MG tablet Take 40 mg by mouth daily.  0   cyclobenzaprine (FLEXERIL) 10 MG tablet Take 10 mg by mouth 2 (two) times daily.     diclofenac sodium (VOLTAREN) 1 % GEL Apply 1 application topically 4 (four) times daily as needed.     fluticasone (FLONASE) 50 MCG/ACT nasal spray Place 2 sprays into both nostrils daily.     gabapentin (NEURONTIN) 300 MG capsule Take 300 mg by mouth at bedtime.     metFORMIN (GLUCOPHAGE-XR) 500 MG 24 hr tablet Take 1,000 mg by mouth daily.     methocarbamol (ROBAXIN) 750 MG tablet Take 1 tablet by mouth 2 (two) times daily as needed.     olmesartan (BENICAR) 20 MG tablet Take 1 tablet by mouth daily.     omeprazole (PRILOSEC) 40 MG capsule Take 1 capsule by mouth daily.     oxyCODONE-acetaminophen (PERCOCET) 10-325 MG tablet TAKE ONE TABLET BY MOUTH EVERY 4 TO 6  HOURS AS NEEDED.  0   VENTOLIN HFA 108 (90 Base) MCG/ACT inhaler Inhale 2 puffs into the lungs every 4 (four) hours as needed.     No current facility-administered medications for this visit.    REVIEW OF SYSTEMS:  [X]  denotes positive finding, [ ]  denotes negative finding Cardiac  Comments:  Chest pain or chest pressure:    Shortness of breath upon exertion: x   Short of breath when lying flat:    Irregular heart rhythm: x       Vascular    Pain in calf, thigh, or hip brought on by ambulation: x   Pain in feet at night that wakes you up from your sleep:     Blood clot in your veins:    Leg swelling:         Pulmonary    Oxygen at home:    Productive cough:  x   Wheezing:  x       Neurologic    Sudden weakness in arms or legs:     Sudden numbness in arms or legs:     Sudden onset of difficulty speaking or slurred speech:    Temporary loss of vision in one eye:     Problems with dizziness:         Gastrointestinal    Blood in stool:     Vomited blood:         Genitourinary    Burning when urinating:     Blood in urine:        Psychiatric    Major depression:         Hematologic    Bleeding problems:    Problems with blood clotting too easily:        Skin    Rashes or ulcers: x       Constitutional    Fever or chills:      PHYSICAL EXAM: Vitals:   04/27/21 1009  BP: 135/82  Pulse: 63  Resp: 18  Temp: (!) 97.3 F (36.3 C)  TempSrc: Temporal  SpO2: (!) 65%  Weight: 252 lb (114.3 kg)  Height: 6' (1.829 m)    GENERAL: The patient is a well-nourished male, in no acute distress. The vital signs are documented above. CARDIOVASCULAR: 2+ femoral pulses bilaterally.  I do not palpate pedal pulses bilaterally PULMONARY: There is good air exchange  MUSCULOSKELETAL: There are no major deformities or cyanosis. NEUROLOGIC: No focal weakness  or paresthesias are detected. SKIN: There are no ulcers or rashes noted. PSYCHIATRIC: The patient has a normal  affect.  DATA:  Noninvasive studies from 922 reveal triphasic flow through the popliteal space bilaterally.  Ankle Imvanex on the right is greater than 1.0.  On the left is 0.8  MEDICAL ISSUES: Discussed these findings at length with the patient.  Explained that he in all likelihood has superficial femoral occlusive disease to explain his claudication symptoms.  I explained that this is not limb threatening.  I explained the importance of a walking program.  Also discussed the critical importance of smoking cessation.  He has a very heavy long history of smoking.  I did gust both endovascular and open surgical treatment for SFA disease.  I certainly would reserve this for limiting claudication.  He was reassured with this discussion will see [place] again on an as-needed basis   Larina Earthly, MD [facility] [phone] Pager [phone]  Note: Portions of this report may have been transcribed using voice recognition software.  Every effort has been made to ensure accuracy; however, inadvertent computerized transcription errors may still be present.

## 2021-05-05 DIAGNOSIS — M19012 Primary osteoarthritis, left shoulder: Secondary | ICD-10-CM | POA: Diagnosis not present

## 2021-05-05 DIAGNOSIS — M542 Cervicalgia: Secondary | ICD-10-CM | POA: Diagnosis not present

## 2021-06-15 DIAGNOSIS — Z789 Other specified health status: Secondary | ICD-10-CM | POA: Diagnosis not present

## 2021-06-15 DIAGNOSIS — Z79899 Other long term (current) drug therapy: Secondary | ICD-10-CM | POA: Diagnosis not present

## 2021-06-15 DIAGNOSIS — G8929 Other chronic pain: Secondary | ICD-10-CM | POA: Diagnosis not present

## 2021-06-15 DIAGNOSIS — M545 Low back pain, unspecified: Secondary | ICD-10-CM | POA: Diagnosis not present

## 2021-07-16 DIAGNOSIS — E119 Type 2 diabetes mellitus without complications: Secondary | ICD-10-CM | POA: Diagnosis not present

## 2021-07-16 DIAGNOSIS — M545 Low back pain, unspecified: Secondary | ICD-10-CM | POA: Diagnosis not present

## 2021-07-16 DIAGNOSIS — Z789 Other specified health status: Secondary | ICD-10-CM | POA: Diagnosis not present

## 2021-07-16 DIAGNOSIS — Z79899 Other long term (current) drug therapy: Secondary | ICD-10-CM | POA: Diagnosis not present

## 2021-07-16 DIAGNOSIS — G8929 Other chronic pain: Secondary | ICD-10-CM | POA: Diagnosis not present

## 2021-07-20 DIAGNOSIS — J441 Chronic obstructive pulmonary disease with (acute) exacerbation: Secondary | ICD-10-CM | POA: Diagnosis not present

## 2021-07-20 DIAGNOSIS — Z20828 Contact with and (suspected) exposure to other viral communicable diseases: Secondary | ICD-10-CM | POA: Diagnosis not present

## 2021-08-11 DIAGNOSIS — J441 Chronic obstructive pulmonary disease with (acute) exacerbation: Secondary | ICD-10-CM | POA: Diagnosis not present

## 2021-08-11 DIAGNOSIS — J209 Acute bronchitis, unspecified: Secondary | ICD-10-CM | POA: Diagnosis not present

## 2021-08-12 DIAGNOSIS — H524 Presbyopia: Secondary | ICD-10-CM | POA: Diagnosis not present

## 2021-08-12 DIAGNOSIS — H2513 Age-related nuclear cataract, bilateral: Secondary | ICD-10-CM | POA: Diagnosis not present

## 2021-08-12 DIAGNOSIS — Z0100 Encounter for examination of eyes and vision without abnormal findings: Secondary | ICD-10-CM | POA: Diagnosis not present

## 2021-08-12 DIAGNOSIS — H52223 Regular astigmatism, bilateral: Secondary | ICD-10-CM | POA: Diagnosis not present

## 2021-08-12 DIAGNOSIS — E119 Type 2 diabetes mellitus without complications: Secondary | ICD-10-CM | POA: Diagnosis not present

## 2021-08-12 DIAGNOSIS — Z7984 Long term (current) use of oral hypoglycemic drugs: Secondary | ICD-10-CM | POA: Diagnosis not present

## 2021-08-13 DIAGNOSIS — E119 Type 2 diabetes mellitus without complications: Secondary | ICD-10-CM | POA: Diagnosis not present

## 2021-08-13 DIAGNOSIS — Z79899 Other long term (current) drug therapy: Secondary | ICD-10-CM | POA: Diagnosis not present

## 2021-08-13 DIAGNOSIS — G8929 Other chronic pain: Secondary | ICD-10-CM | POA: Diagnosis not present

## 2021-08-13 DIAGNOSIS — M545 Low back pain, unspecified: Secondary | ICD-10-CM | POA: Diagnosis not present

## 2021-08-19 ENCOUNTER — Encounter: Payer: Self-pay | Admitting: Cardiology

## 2021-08-19 ENCOUNTER — Encounter: Payer: Self-pay | Admitting: *Deleted

## 2021-08-19 ENCOUNTER — Ambulatory Visit (INDEPENDENT_AMBULATORY_CARE_PROVIDER_SITE_OTHER): Payer: Medicare HMO | Admitting: Cardiology

## 2021-08-19 VITALS — BP 163/75 | HR 90 | Ht 72.0 in | Wt 247.0 lb

## 2021-08-19 DIAGNOSIS — I350 Nonrheumatic aortic (valve) stenosis: Secondary | ICD-10-CM | POA: Diagnosis not present

## 2021-08-19 DIAGNOSIS — I739 Peripheral vascular disease, unspecified: Secondary | ICD-10-CM | POA: Diagnosis not present

## 2021-08-19 DIAGNOSIS — E782 Mixed hyperlipidemia: Secondary | ICD-10-CM | POA: Diagnosis not present

## 2021-08-19 DIAGNOSIS — I10 Essential (primary) hypertension: Secondary | ICD-10-CM | POA: Diagnosis not present

## 2021-08-19 NOTE — Patient Instructions (Addendum)

## 2021-08-19 NOTE — Progress Notes (Signed)
Clinical Summary Mr. Reidinger is a 60 y.o.male former patient of Dr Purvis Sheffield, this is our first visit together. Seen for the following medical problems.  1.Aortic stenosis - Echocardiogram on 03/10/2017 demonstrated normal LV systolic function, LVEF 60 to 10%, moderate LVH, mild to moderate aortic stenosis, and mild aortic root and proximal ascending aortic dilatation, both of which measured 37 mm.  -12/2020 echo: LVEF 65-70%, grade I dd, normal RV, Mod AS mean grade 22 AVA VTI 1.32 - no recent chest pains, no SOB/DOE  2. HTN - compliant with meds - at recent vascular visit 135/82 - reports last week 120s/70s at other doctor visit.   3. Aortic root diltation    4. Atypical chest pain  5.PAD - 03/2021 ABI normal right, mild left 0.82 - 03/2021 arterial US right occluded ATA, normal left - seen by vascular, recs to monitor at this time.  - pain is improving, no limiting  6. Hyperlipidemia - compliant with meds - labs followed by pcp    Past Medical History:  Diagnosis Date   Arthrosis of left acromioclavicular joint    Chronic back pain    Complete rotator cuff tear of left shoulder    Current every day smoker    smokes a pack and a half a day   Hiatal hernia    HTN (hypertension)    Impingement syndrome of left shoulder    Pain management contract discussed    post op pain meds to be managed by [facility] who currently manages his pain meds     No Known Allergies   Current Outpatient Medications  Medication Sig Dispense Refill   amLODipine (NORVASC) 10 MG tablet TAKE ONE TABLET BY MOUTH DAILY. 30 tablet 0   aspirin 81 MG tablet Take 81 mg by mouth daily.     atorvastatin (LIPITOR) 20 MG tablet Take 1 tablet by mouth daily.     Blood Pressure Monitor MISC Use as directed 1 each 0   busPIRone (BUSPAR) 5 MG tablet Take 5 mg by mouth 2 (two) times daily as needed. for anxiety  2   carvedilol (COREG) 3.125 MG tablet Take 3.125 mg by mouth 2 (two)  times daily with a meal.     citalopram (CELEXA) 40 MG tablet Take 40 mg by mouth daily.  0   cyclobenzaprine (FLEXERIL) 10 MG tablet Take 10 mg by mouth 2 (two) times daily.     diclofenac sodium (VOLTAREN) 1 % GEL Apply 1 application topically 4 (four) times daily as needed.     fluticasone (FLONASE) 50 MCG/ACT nasal spray Place 2 sprays into both nostrils daily.     gabapentin (NEURONTIN) 300 MG capsule Take 300 mg by mouth at bedtime.     metFORMIN (GLUCOPHAGE-XR) 500 MG 24 hr tablet Take 1,000 mg by mouth daily.     methocarbamol (ROBAXIN) 750 MG tablet Take 1 tablet by mouth 2 (two) times daily as needed.     olmesartan (BENICAR) 20 MG tablet Take 1 tablet by mouth daily.     omeprazole (PRILOSEC) 40 MG capsule Take 1 capsule by mouth daily.     oxyCODONE-acetaminophen (PERCOCET) 10-325 MG tablet TAKE ONE TABLET BY MOUTH EVERY 4 TO 6 HOURS AS NEEDED.  0   VENTOLIN HFA 108 (90 Base) MCG/ACT inhaler Inhale 2 puffs into the lungs every 4 (four) hours as needed.     No current facility-administered medications for this visit.     Past Surgical History:  Procedure Laterality Date   right knee surgery       No Known Allergies    Family History  Problem Relation Age of Onset   Coronary artery disease Mother    Coronary artery disease Father    Heart attack Father    Heart attack Brother 100       deceased   Asthma Other      Social History Mr. Demir reports that he has been smoking cigarettes. He started smoking about 48 years ago. He has been smoking an average of 1.5 packs per day. He has never used smokeless tobacco. Mr. Hillmer reports current alcohol use.   Review of Systems CONSTITUTIONAL: No weight loss, fever, chills, weakness or fatigue.  HEENT: Eyes: No visual loss, blurred vision, double vision or yellow sclerae.No hearing loss, sneezing, congestion, runny nose or sore throat.  SKIN: No rash or itching.  CARDIOVASCULAR: per hpi RESPIRATORY: No shortness of  breath, cough or sputum.  GASTROINTESTINAL: No anorexia, nausea, vomiting or diarrhea. No abdominal pain or blood.  GENITOURINARY: No burning on urination, no polyuria NEUROLOGICAL: No headache, dizziness, syncope, paralysis, ataxia, numbness or tingling in the extremities. No change in bowel or bladder control.  MUSCULOSKELETAL: per hpi LYMPHATICS: No enlarged nodes. No history of splenectomy.  PSYCHIATRIC: No history of depression or anxiety.  ENDOCRINOLOGIC: No reports of sweating, cold or heat intolerance. No polyuria or polydipsia.  Marland Kitchen   Physical Examination Today's Vitals   08/19/21 1053  BP: (!) 163/75  Pulse: 90  SpO2: 99%  Weight: 247 lb (112 kg)  Height: 6' (1.829 m)   Body mass index is 33.5 kg/m.  Gen: resting comfortably, no acute distress HEENT: no scleral icterus, pupils equal round and reactive, no palptable cervical adenopathy,  CV: RRR, 3/6 systolic murmur rusb, no jvd Resp: Clear to auscultation bilaterally GI: abdomen is soft, non-tender, non-distended, normal bowel sounds, no hepatosplenomegaly MSK: extremities are warm, no edema.  Skin: warm, no rash Neuro:  no focal deficits Psych: appropriate affect   Diagnostic Studies  12/2020 echo  1. Left ventricular ejection fraction, by estimation, is 65 to 70%. The  left ventricle has normal function. The left ventricle has no regional  wall motion abnormalities. Left ventricular diastolic parameters are  consistent with Grade I diastolic  dysfunction (impaired relaxation). The average left ventricular global  longitudinal strain is -17.2 %. The global longitudinal strain is normal.   2. Right ventricular systolic function is normal. The right ventricular  size is normal.   3. The mitral valve is normal in structure. No evidence of mitral valve  regurgitation. No evidence of mitral stenosis.   4. The aortic valve is tricuspid. There is moderate calcification of the  aortic valve. There is moderate  thickening of the aortic valve. Aortic  valve regurgitation is not visualized. Moderate aortic valve stenosis.  Aortic valve mean gradient measures  22.0 mmHg. Aortic valve peak gradient measures 41.2 mmHg. Aortic valve  area, by VTI measures 1.32 cm.   5. The inferior vena cava is normal in size with greater than 50%  respiratory variability, suggesting right atrial pressure of 3 mmHg.    03/2021 LE arterial US Summary:  Right: Total occlusion noted in the anterior tibial artery. Mid ATA  segment.      Left: No obstructive disease.    03/2021 ABI Summary:  Right: Resting right ankle-brachial index is within normal range. No  evidence of significant right lower extremity arterial disease. The right  toe-brachial  index is abnormal.   Left: Resting left ankle-brachial index indicates mild left lower  extremity arterial disease. The left toe-brachial index is abnormal     Assessment and Plan  1.Aortic stenosis - moderate AS, asymptomatic - conitnue to monitor, repeat echo at next visit  2. HTN - elevated today, reports multiple doctors visits was at goal last week - continue current meds  3. Hyperlipidemia - request pcp labs, continue statin  4. PAD - mild nonlimiting symptoms, continue medical therapy.    F/u 6 months   Antoine Poche, M.D

## 2021-09-07 DIAGNOSIS — R059 Cough, unspecified: Secondary | ICD-10-CM | POA: Diagnosis not present

## 2021-09-07 DIAGNOSIS — R69 Illness, unspecified: Secondary | ICD-10-CM | POA: Diagnosis not present

## 2021-09-07 DIAGNOSIS — J441 Chronic obstructive pulmonary disease with (acute) exacerbation: Secondary | ICD-10-CM | POA: Diagnosis not present

## 2021-09-16 ENCOUNTER — Institutional Professional Consult (permissible substitution): Payer: Medicare HMO | Admitting: Internal Medicine

## 2021-09-16 DIAGNOSIS — M545 Low back pain, unspecified: Secondary | ICD-10-CM | POA: Diagnosis not present

## 2021-09-16 DIAGNOSIS — Z789 Other specified health status: Secondary | ICD-10-CM | POA: Diagnosis not present

## 2021-09-16 DIAGNOSIS — E119 Type 2 diabetes mellitus without complications: Secondary | ICD-10-CM | POA: Diagnosis not present

## 2021-09-16 DIAGNOSIS — G8929 Other chronic pain: Secondary | ICD-10-CM | POA: Diagnosis not present

## 2021-09-16 DIAGNOSIS — Z79899 Other long term (current) drug therapy: Secondary | ICD-10-CM | POA: Diagnosis not present

## 2021-10-16 DIAGNOSIS — M545 Low back pain, unspecified: Secondary | ICD-10-CM | POA: Diagnosis not present

## 2021-10-16 DIAGNOSIS — E559 Vitamin D deficiency, unspecified: Secondary | ICD-10-CM | POA: Diagnosis not present

## 2021-10-16 DIAGNOSIS — Z79899 Other long term (current) drug therapy: Secondary | ICD-10-CM | POA: Diagnosis not present

## 2021-10-16 DIAGNOSIS — R030 Elevated blood-pressure reading, without diagnosis of hypertension: Secondary | ICD-10-CM | POA: Diagnosis not present

## 2021-10-16 DIAGNOSIS — E119 Type 2 diabetes mellitus without complications: Secondary | ICD-10-CM | POA: Diagnosis not present

## 2021-10-16 DIAGNOSIS — G8929 Other chronic pain: Secondary | ICD-10-CM | POA: Diagnosis not present

## 2021-10-16 DIAGNOSIS — Z6832 Body mass index (BMI) 32.0-32.9, adult: Secondary | ICD-10-CM | POA: Diagnosis not present

## 2021-11-13 DIAGNOSIS — Z6832 Body mass index (BMI) 32.0-32.9, adult: Secondary | ICD-10-CM | POA: Diagnosis not present

## 2021-11-13 DIAGNOSIS — Z0130 Encounter for examination of blood pressure without abnormal findings: Secondary | ICD-10-CM | POA: Diagnosis not present

## 2021-11-13 DIAGNOSIS — Z79899 Other long term (current) drug therapy: Secondary | ICD-10-CM | POA: Diagnosis not present

## 2021-11-13 DIAGNOSIS — G8929 Other chronic pain: Secondary | ICD-10-CM | POA: Diagnosis not present

## 2021-11-13 DIAGNOSIS — E559 Vitamin D deficiency, unspecified: Secondary | ICD-10-CM | POA: Diagnosis not present

## 2021-11-13 DIAGNOSIS — E119 Type 2 diabetes mellitus without complications: Secondary | ICD-10-CM | POA: Diagnosis not present

## 2021-11-13 DIAGNOSIS — M545 Low back pain, unspecified: Secondary | ICD-10-CM | POA: Diagnosis not present

## 2021-12-14 DIAGNOSIS — E119 Type 2 diabetes mellitus without complications: Secondary | ICD-10-CM | POA: Diagnosis not present

## 2021-12-14 DIAGNOSIS — Z789 Other specified health status: Secondary | ICD-10-CM | POA: Diagnosis not present

## 2021-12-14 DIAGNOSIS — Z0130 Encounter for examination of blood pressure without abnormal findings: Secondary | ICD-10-CM | POA: Diagnosis not present

## 2021-12-14 DIAGNOSIS — G8929 Other chronic pain: Secondary | ICD-10-CM | POA: Diagnosis not present

## 2021-12-14 DIAGNOSIS — Z6832 Body mass index (BMI) 32.0-32.9, adult: Secondary | ICD-10-CM | POA: Diagnosis not present

## 2021-12-14 DIAGNOSIS — Z79899 Other long term (current) drug therapy: Secondary | ICD-10-CM | POA: Diagnosis not present

## 2021-12-14 DIAGNOSIS — M545 Low back pain, unspecified: Secondary | ICD-10-CM | POA: Diagnosis not present

## 2021-12-16 DIAGNOSIS — Z79899 Other long term (current) drug therapy: Secondary | ICD-10-CM | POA: Diagnosis not present

## 2021-12-22 DIAGNOSIS — R69 Illness, unspecified: Secondary | ICD-10-CM | POA: Diagnosis not present

## 2021-12-22 DIAGNOSIS — M542 Cervicalgia: Secondary | ICD-10-CM | POA: Diagnosis not present

## 2021-12-22 DIAGNOSIS — Z6834 Body mass index (BMI) 34.0-34.9, adult: Secondary | ICD-10-CM | POA: Diagnosis not present

## 2022-01-04 DIAGNOSIS — N529 Male erectile dysfunction, unspecified: Secondary | ICD-10-CM | POA: Diagnosis not present

## 2022-01-04 DIAGNOSIS — Z7951 Long term (current) use of inhaled steroids: Secondary | ICD-10-CM | POA: Diagnosis not present

## 2022-01-04 DIAGNOSIS — K219 Gastro-esophageal reflux disease without esophagitis: Secondary | ICD-10-CM | POA: Diagnosis not present

## 2022-01-04 DIAGNOSIS — Z791 Long term (current) use of non-steroidal anti-inflammatories (NSAID): Secondary | ICD-10-CM | POA: Diagnosis not present

## 2022-01-04 DIAGNOSIS — E669 Obesity, unspecified: Secondary | ICD-10-CM | POA: Diagnosis not present

## 2022-01-04 DIAGNOSIS — E1151 Type 2 diabetes mellitus with diabetic peripheral angiopathy without gangrene: Secondary | ICD-10-CM | POA: Diagnosis not present

## 2022-01-04 DIAGNOSIS — I10 Essential (primary) hypertension: Secondary | ICD-10-CM | POA: Diagnosis not present

## 2022-01-04 DIAGNOSIS — E785 Hyperlipidemia, unspecified: Secondary | ICD-10-CM | POA: Diagnosis not present

## 2022-01-04 DIAGNOSIS — Z008 Encounter for other general examination: Secondary | ICD-10-CM | POA: Diagnosis not present

## 2022-01-04 DIAGNOSIS — J449 Chronic obstructive pulmonary disease, unspecified: Secondary | ICD-10-CM | POA: Diagnosis not present

## 2022-01-04 DIAGNOSIS — R69 Illness, unspecified: Secondary | ICD-10-CM | POA: Diagnosis not present

## 2022-01-04 DIAGNOSIS — G8929 Other chronic pain: Secondary | ICD-10-CM | POA: Diagnosis not present

## 2022-01-14 DIAGNOSIS — Z79899 Other long term (current) drug therapy: Secondary | ICD-10-CM | POA: Diagnosis not present

## 2022-01-18 DIAGNOSIS — Z0130 Encounter for examination of blood pressure without abnormal findings: Secondary | ICD-10-CM | POA: Diagnosis not present

## 2022-01-18 DIAGNOSIS — Z79899 Other long term (current) drug therapy: Secondary | ICD-10-CM | POA: Diagnosis not present

## 2022-01-18 DIAGNOSIS — M545 Low back pain, unspecified: Secondary | ICD-10-CM | POA: Diagnosis not present

## 2022-01-18 DIAGNOSIS — R5383 Other fatigue: Secondary | ICD-10-CM | POA: Diagnosis not present

## 2022-01-18 DIAGNOSIS — G8929 Other chronic pain: Secondary | ICD-10-CM | POA: Diagnosis not present

## 2022-01-18 DIAGNOSIS — Z6832 Body mass index (BMI) 32.0-32.9, adult: Secondary | ICD-10-CM | POA: Diagnosis not present

## 2022-01-18 DIAGNOSIS — E119 Type 2 diabetes mellitus without complications: Secondary | ICD-10-CM | POA: Diagnosis not present

## 2022-02-04 DIAGNOSIS — Z6833 Body mass index (BMI) 33.0-33.9, adult: Secondary | ICD-10-CM | POA: Diagnosis not present

## 2022-02-04 DIAGNOSIS — R69 Illness, unspecified: Secondary | ICD-10-CM | POA: Diagnosis not present

## 2022-02-04 DIAGNOSIS — M542 Cervicalgia: Secondary | ICD-10-CM | POA: Diagnosis not present

## 2022-02-18 DIAGNOSIS — G8929 Other chronic pain: Secondary | ICD-10-CM | POA: Diagnosis not present

## 2022-02-18 DIAGNOSIS — E119 Type 2 diabetes mellitus without complications: Secondary | ICD-10-CM | POA: Diagnosis not present

## 2022-02-18 DIAGNOSIS — M545 Low back pain, unspecified: Secondary | ICD-10-CM | POA: Diagnosis not present

## 2022-02-18 DIAGNOSIS — Z79899 Other long term (current) drug therapy: Secondary | ICD-10-CM | POA: Diagnosis not present

## 2022-02-18 DIAGNOSIS — Z0130 Encounter for examination of blood pressure without abnormal findings: Secondary | ICD-10-CM | POA: Diagnosis not present

## 2022-02-18 DIAGNOSIS — Z6833 Body mass index (BMI) 33.0-33.9, adult: Secondary | ICD-10-CM | POA: Diagnosis not present

## 2022-02-18 DIAGNOSIS — E781 Pure hyperglyceridemia: Secondary | ICD-10-CM | POA: Diagnosis not present

## 2022-02-25 ENCOUNTER — Encounter: Payer: Self-pay | Admitting: Cardiology

## 2022-02-25 ENCOUNTER — Ambulatory Visit (INDEPENDENT_AMBULATORY_CARE_PROVIDER_SITE_OTHER): Payer: Medicare HMO | Admitting: Cardiology

## 2022-02-25 VITALS — BP 128/78 | HR 82 | Ht 72.0 in | Wt 244.6 lb

## 2022-02-25 DIAGNOSIS — I10 Essential (primary) hypertension: Secondary | ICD-10-CM | POA: Diagnosis not present

## 2022-02-25 DIAGNOSIS — I350 Nonrheumatic aortic (valve) stenosis: Secondary | ICD-10-CM | POA: Diagnosis not present

## 2022-02-25 DIAGNOSIS — I739 Peripheral vascular disease, unspecified: Secondary | ICD-10-CM | POA: Diagnosis not present

## 2022-02-25 DIAGNOSIS — E782 Mixed hyperlipidemia: Secondary | ICD-10-CM

## 2022-02-25 NOTE — Patient Instructions (Signed)

## 2022-02-25 NOTE — Progress Notes (Signed)
? ? ? ?Clinical Summary ?Mr. Burggraf is a 61 y.o.male ? ?1.Aortic stenosis ?- Echocardiogram on 03/10/2017 demonstrated normal LV systolic function, LVEF 60 to 16%, moderate LVH, mild to moderate aortic stenosis, and mild aortic root and proximal ascending aortic dilatation, both of which measured 37 mm. ?  ?-12/2020 echo: LVEF 65-70%, grade I dd, normal RV, Mod AS mean grade 22 AVA VTI 1.32 ?- no recent SOB/DOE, no chest pain ? ?  ?2. HTN  ?- compliant with meds ?- home bp's 130s/80s ?  ?  ? ?3.PAD ?- 03/2021 ABI normal right, mild left 0.82 ?- 03/2021 arterial US right occluded ATA, normal left ?- seen by vascular 04/2021, recs to monitor at this time.F/u just as needed ?  ?- pain is improving, remains not limiting.  ?  ?6. Hyperlipidemia ?- reports recent labs with his pain doctor Dr Thompson Grayer in [place] ?- he is on pravastatin ? ? ?Enjoys riding his motorcycle. Raising 2 grandchilder ages 16 and 4. ? ? ?Past Medical History:  ?Diagnosis Date  ? Arthrosis of left acromioclavicular joint   ? Chronic back pain   ? Complete rotator cuff tear of left shoulder   ? Current every day smoker   ? smokes a pack and a half a day  ? Hiatal hernia   ? HTN (hypertension)   ? Impingement syndrome of left shoulder   ? Pain management contract discussed   ? post op pain meds to be managed by [facility] who currently manages his pain meds  ? ? ? ?No Known Allergies ? ? ?Current Outpatient Medications  ?Medication Sig Dispense Refill  ? amLODipine (NORVASC) 10 MG tablet TAKE ONE TABLET BY MOUTH DAILY. 30 tablet 0  ? aspirin 81 MG tablet Take 81 mg by mouth daily.    ? Blood Pressure Monitor MISC Use as directed 1 each 0  ? busPIRone (BUSPAR) 5 MG tablet Take 5 mg by mouth 2 (two) times daily as needed. for anxiety  2  ? carvedilol (COREG) 3.125 MG tablet Take 3.125 mg by mouth 2 (two) times daily with a meal.    ? citalopram (CELEXA) 40 MG tablet Take 40 mg by mouth daily.  0  ? cyclobenzaprine (FLEXERIL) 10 MG tablet  Take 10 mg by mouth 2 (two) times daily.    ? diclofenac sodium (VOLTAREN) 1 % GEL Apply 1 application topically 4 (four) times daily as needed.    ? fenofibrate (TRICOR) 48 MG tablet Take 48 mg by mouth daily.    ? fluticasone (FLONASE) 50 MCG/ACT nasal spray Place 2 sprays into both nostrils daily.    ? gabapentin (NEURONTIN) 300 MG capsule Take 300 mg by mouth at bedtime.    ? metFORMIN (GLUCOPHAGE-XR) 500 MG 24 hr tablet Take 1,000 mg by mouth daily.    ? methocarbamol (ROBAXIN) 750 MG tablet Take 1 tablet by mouth 2 (two) times daily as needed.    ? olmesartan (BENICAR) 40 MG tablet Take 40 mg by mouth daily.    ? omeprazole (PRILOSEC) 40 MG capsule Take 1 capsule by mouth daily.    ? oxyCODONE-acetaminophen (PERCOCET) 10-325 MG tablet TAKE ONE TABLET BY MOUTH EVERY 4 TO 6 HOURS AS NEEDED.  0  ? pravastatin (PRAVACHOL) 20 MG tablet Take 20 mg by mouth daily.    ? RYBELSUS 3 MG TABS Take 1 tablet by mouth daily.    ? TRELEGY ELLIPTA 100-62.5-25 MCG/ACT AEPB Take 1 puff by mouth daily.    ?  VENTOLIN HFA 108 (90 Base) MCG/ACT inhaler Inhale 2 puffs into the lungs every 4 (four) hours as needed.    ? ?No current facility-administered medications for this visit.  ? ? ? ?Past Surgical History:  ?Procedure Laterality Date  ? right knee surgery    ? ? ? ?No Known Allergies ? ? ? ?Family History  ?Problem Relation Age of Onset  ? Coronary artery disease Mother   ? Coronary artery disease Father   ? Heart attack Father   ? Heart attack Brother 54  ?     deceased  ? Asthma Other   ? ? ? ?Social History ?Mr. Brandenburg reports that he has been smoking cigarettes. He started smoking about 49 years ago. He has been smoking an average of 1.5 packs per day. He has never used smokeless tobacco. ?Mr. Bessey reports current alcohol use. ? ? ?Review of Systems ?CONSTITUTIONAL: No weight loss, fever, chills, weakness or fatigue.  ?HEENT: Eyes: No visual loss, blurred vision, double vision or yellow sclerae.No hearing loss, sneezing,  congestion, runny nose or sore throat.  ?SKIN: No rash or itching.  ?CARDIOVASCULAR: per hpi ?RESPIRATORY: No shortness of breath, cough or sputum.  ?GASTROINTESTINAL: No anorexia, nausea, vomiting or diarrhea. No abdominal pain or blood.  ?GENITOURINARY: No burning on urination, no polyuria ?NEUROLOGICAL: No headache, dizziness, syncope, paralysis, ataxia, numbness or tingling in the extremities. No change in bowel or bladder control.  ?MUSCULOSKELETAL: No muscle, back pain, joint pain or stiffness.  ?LYMPHATICS: No enlarged nodes. No history of splenectomy.  ?PSYCHIATRIC: No history of depression or anxiety.  ?ENDOCRINOLOGIC: No reports of sweating, cold or heat intolerance. No polyuria or polydipsia.  ?. ? ? ?Physical Examination ?Today's Vitals  ? 02/25/22 0911 02/25/22 0944  ?BP: 140/90 128/78  ?Pulse: 82   ?SpO2: 95%   ?Weight: 244 lb 9.6 oz (110.9 kg)   ?Height: 6' (1.829 m)   ? ?Body mass index is 33.17 kg/m?. ? ?Gen: resting comfortably, no acute distress ?HEENT: no scleral icterus, pupils equal round and reactive, no palptable cervical adenopathy,  ?CV: RRR, 3/6 systolic murmur rusb, no jvd ?Resp: Clear to auscultation bilaterally ?GI: abdomen is soft, non-tender, non-distended, normal bowel sounds, no hepatosplenomegaly ?MSK: extremities are warm, no edema.  ?Skin: warm, no rash ?Neuro:  no focal deficits ?Psych: appropriate affect ? ? ?Diagnostic Studies ?12/2020 echo ? 1. Left ventricular ejection fraction, by estimation, is 65 to 70%. The  ?left ventricle has normal function. The left ventricle has no regional  ?wall motion abnormalities. Left ventricular diastolic parameters are  ?consistent with Grade I diastolic  ?dysfunction (impaired relaxation). The average left ventricular global  ?longitudinal strain is -17.2 %. The global longitudinal strain is normal.  ? 2. Right ventricular systolic function is normal. The right ventricular  ?size is normal.  ? 3. The mitral valve is normal in structure. No  evidence of mitral valve  ?regurgitation. No evidence of mitral stenosis.  ? 4. The aortic valve is tricuspid. There is moderate calcification of the  ?aortic valve. There is moderate thickening of the aortic valve. Aortic  ?valve regurgitation is not visualized. Moderate aortic valve stenosis.  ?Aortic valve mean gradient measures  ?22.0 mmHg. Aortic valve peak gradient measures 41.2 mmHg. Aortic valve  ?area, by VTI measures 1.32 cm?.  ? 5. The inferior vena cava is normal in size with greater than 50%  ?respiratory variability, suggesting right atrial pressure of 3 mmHg.  ?  ?  ?03/2021 LE arterial US ?  Summary:  ?Right: Total occlusion noted in the anterior tibial artery. Mid ATA  ?segment.  ?   ? ?Left: No obstructive disease.  ?  ?  ?03/2021 ABI ?Summary:  ?Right: Resting right ankle-brachial index is within normal range. No  ?evidence of significant right lower extremity arterial disease. The right  ?toe-brachial index is abnormal.  ? ?Left: Resting left ankle-brachial index indicates mild left lower  ?extremity arterial disease. The left toe-brachial index is abnormal ?  ?  ? ? ? ?Assessment and Plan  ?1.Aortic stenosis ?- moderate AS, asymptomatic ?- repeat echo for ongoign surveillance.  ?  ?2. HTN ?- at goal, continue current meds ?  ?3. Hyperlipidemia ?- request labs from his pain clinic, continue current meds ? ?4. PAD ?- mild nonlimiting symptoms ?- continue medical therapy ?- seen by vascular, recs to f/u just as needed ? ?EKG today shows NSR ? ? ?F/u 6 months ? ?Antoine PocheJonathan F. Yarieliz Wasser, M.D. ?

## 2022-03-17 ENCOUNTER — Ambulatory Visit (INDEPENDENT_AMBULATORY_CARE_PROVIDER_SITE_OTHER): Payer: Medicare HMO

## 2022-03-17 DIAGNOSIS — I350 Nonrheumatic aortic (valve) stenosis: Secondary | ICD-10-CM | POA: Diagnosis not present

## 2022-03-17 LAB — ECHOCARDIOGRAM COMPLETE
AR max vel: 0.95 cm2
AV Area VTI: 1.11 cm2
AV Area mean vel: 1.05 cm2
AV Mean grad: 24.6 mmHg
AV Peak grad: 42.9 mmHg
Ao pk vel: 3.28 m/s
Area-P 1/2: 2.12 cm2
Calc EF: 71.2 %
S' Lateral: 2.72 cm
Single Plane A2C EF: 59 %
Single Plane A4C EF: 79.9 %

## 2022-03-22 DIAGNOSIS — E119 Type 2 diabetes mellitus without complications: Secondary | ICD-10-CM | POA: Diagnosis not present

## 2022-03-22 DIAGNOSIS — Z6831 Body mass index (BMI) 31.0-31.9, adult: Secondary | ICD-10-CM | POA: Diagnosis not present

## 2022-03-22 DIAGNOSIS — M545 Low back pain, unspecified: Secondary | ICD-10-CM | POA: Diagnosis not present

## 2022-03-22 DIAGNOSIS — R030 Elevated blood-pressure reading, without diagnosis of hypertension: Secondary | ICD-10-CM | POA: Diagnosis not present

## 2022-03-22 DIAGNOSIS — G8929 Other chronic pain: Secondary | ICD-10-CM | POA: Diagnosis not present

## 2022-03-22 DIAGNOSIS — Z79899 Other long term (current) drug therapy: Secondary | ICD-10-CM | POA: Diagnosis not present

## 2022-03-30 ENCOUNTER — Telehealth: Payer: Self-pay | Admitting: Cardiology

## 2022-03-30 NOTE — Telephone Encounter (Signed)
Please give pt a call with Echo results-[phone]

## 2022-03-31 ENCOUNTER — Telehealth: Payer: Self-pay | Admitting: Cardiology

## 2022-03-31 DIAGNOSIS — Z79899 Other long term (current) drug therapy: Secondary | ICD-10-CM | POA: Diagnosis not present

## 2022-03-31 DIAGNOSIS — I350 Nonrheumatic aortic (valve) stenosis: Secondary | ICD-10-CM

## 2022-03-31 NOTE — Telephone Encounter (Signed)
Was returning call for results. Please advise 

## 2022-04-01 ENCOUNTER — Other Ambulatory Visit: Payer: Medicare HMO

## 2022-04-01 DIAGNOSIS — M47816 Spondylosis without myelopathy or radiculopathy, lumbar region: Secondary | ICD-10-CM | POA: Diagnosis not present

## 2022-04-01 DIAGNOSIS — I714 Abdominal aortic aneurysm, without rupture, unspecified: Secondary | ICD-10-CM | POA: Diagnosis not present

## 2022-04-01 DIAGNOSIS — K219 Gastro-esophageal reflux disease without esophagitis: Secondary | ICD-10-CM | POA: Diagnosis not present

## 2022-04-01 DIAGNOSIS — M47814 Spondylosis without myelopathy or radiculopathy, thoracic region: Secondary | ICD-10-CM | POA: Diagnosis not present

## 2022-04-01 DIAGNOSIS — E7849 Other hyperlipidemia: Secondary | ICD-10-CM | POA: Diagnosis not present

## 2022-04-01 DIAGNOSIS — Z6832 Body mass index (BMI) 32.0-32.9, adult: Secondary | ICD-10-CM | POA: Diagnosis not present

## 2022-04-01 DIAGNOSIS — E782 Mixed hyperlipidemia: Secondary | ICD-10-CM | POA: Diagnosis not present

## 2022-04-01 DIAGNOSIS — R69 Illness, unspecified: Secondary | ICD-10-CM | POA: Diagnosis not present

## 2022-04-01 DIAGNOSIS — I10 Essential (primary) hypertension: Secondary | ICD-10-CM | POA: Diagnosis not present

## 2022-04-01 DIAGNOSIS — M545 Low back pain, unspecified: Secondary | ICD-10-CM | POA: Diagnosis not present

## 2022-04-01 DIAGNOSIS — G894 Chronic pain syndrome: Secondary | ICD-10-CM | POA: Diagnosis not present

## 2022-04-01 NOTE — Telephone Encounter (Signed)
Patient was returning call. Please advise ?

## 2022-04-01 NOTE — Telephone Encounter (Signed)
Addressed by [employer] staff.

## 2022-04-01 NOTE — Telephone Encounter (Signed)
Gerald Poche, MD:  Echo shows aortic valve remains moderately stiff, has not significantly progressed. Just something for [place] to continue to monitor with repeat US 1 year

## 2022-04-01 NOTE — Telephone Encounter (Signed)
Left a message for pt to call office back for results

## 2022-04-02 NOTE — Telephone Encounter (Signed)
Echo results discussed with patient, order placed for f/u in 1 year,copied pcp

## 2022-04-02 NOTE — Addendum Note (Signed)
Addended by: Marlyn Corporal A on: 04/02/2022 04:49 PM   Modules accepted: Orders

## 2022-04-05 DIAGNOSIS — E875 Hyperkalemia: Secondary | ICD-10-CM | POA: Diagnosis not present

## 2022-04-05 DIAGNOSIS — R5383 Other fatigue: Secondary | ICD-10-CM | POA: Diagnosis not present

## 2022-04-06 DIAGNOSIS — G894 Chronic pain syndrome: Secondary | ICD-10-CM | POA: Diagnosis not present

## 2022-04-06 DIAGNOSIS — M5416 Radiculopathy, lumbar region: Secondary | ICD-10-CM | POA: Diagnosis not present

## 2022-04-06 DIAGNOSIS — M47816 Spondylosis without myelopathy or radiculopathy, lumbar region: Secondary | ICD-10-CM | POA: Diagnosis not present

## 2022-04-06 DIAGNOSIS — R69 Illness, unspecified: Secondary | ICD-10-CM | POA: Diagnosis not present

## 2022-04-06 DIAGNOSIS — K219 Gastro-esophageal reflux disease without esophagitis: Secondary | ICD-10-CM | POA: Diagnosis not present

## 2022-04-06 DIAGNOSIS — I10 Essential (primary) hypertension: Secondary | ICD-10-CM | POA: Diagnosis not present

## 2022-04-06 DIAGNOSIS — M47814 Spondylosis without myelopathy or radiculopathy, thoracic region: Secondary | ICD-10-CM | POA: Diagnosis not present

## 2022-04-06 DIAGNOSIS — E7849 Other hyperlipidemia: Secondary | ICD-10-CM | POA: Diagnosis not present

## 2022-04-06 DIAGNOSIS — I714 Abdominal aortic aneurysm, without rupture, unspecified: Secondary | ICD-10-CM | POA: Diagnosis not present

## 2022-04-21 ENCOUNTER — Other Ambulatory Visit: Payer: Self-pay | Admitting: Physician Assistant

## 2022-04-21 DIAGNOSIS — R160 Hepatomegaly, not elsewhere classified: Secondary | ICD-10-CM

## 2022-04-21 DIAGNOSIS — N2889 Other specified disorders of kidney and ureter: Secondary | ICD-10-CM

## 2022-04-23 ENCOUNTER — Ambulatory Visit
Admission: RE | Admit: 2022-04-23 | Discharge: 2022-04-23 | Disposition: A | Discharge status: Home / Self Care | Payer: Medicare HMO | Source: Ambulatory Visit | Attending: Physician Assistant | Admitting: Physician Assistant

## 2022-04-23 DIAGNOSIS — I714 Abdominal aortic aneurysm, without rupture, unspecified: Secondary | ICD-10-CM | POA: Diagnosis not present

## 2022-04-23 DIAGNOSIS — R160 Hepatomegaly, not elsewhere classified: Secondary | ICD-10-CM

## 2022-04-23 DIAGNOSIS — N2889 Other specified disorders of kidney and ureter: Secondary | ICD-10-CM

## 2022-04-23 DIAGNOSIS — N281 Cyst of kidney, acquired: Secondary | ICD-10-CM | POA: Diagnosis not present

## 2022-04-23 MED ORDER — GADOBENATE DIMEGLUMINE 529 MG/ML IV SOLN
20.0000 mL | Freq: Once | INTRAVENOUS | Status: AC | PRN
  Administered 2022-04-23: 20 mL via INTRAVENOUS

## 2022-05-06 DIAGNOSIS — Z1389 Encounter for screening for other disorder: Secondary | ICD-10-CM | POA: Diagnosis not present

## 2022-05-06 DIAGNOSIS — F419 Anxiety disorder, unspecified: Secondary | ICD-10-CM | POA: Diagnosis not present

## 2022-05-06 DIAGNOSIS — K219 Gastro-esophageal reflux disease without esophagitis: Secondary | ICD-10-CM | POA: Diagnosis not present

## 2022-05-06 DIAGNOSIS — R69 Illness, unspecified: Secondary | ICD-10-CM | POA: Diagnosis not present

## 2022-05-06 DIAGNOSIS — Z1331 Encounter for screening for depression: Secondary | ICD-10-CM | POA: Diagnosis not present

## 2022-05-06 DIAGNOSIS — G894 Chronic pain syndrome: Secondary | ICD-10-CM | POA: Diagnosis not present

## 2022-05-06 DIAGNOSIS — I10 Essential (primary) hypertension: Secondary | ICD-10-CM | POA: Diagnosis not present

## 2022-05-06 DIAGNOSIS — Z6832 Body mass index (BMI) 32.0-32.9, adult: Secondary | ICD-10-CM | POA: Diagnosis not present

## 2022-05-06 DIAGNOSIS — M47816 Spondylosis without myelopathy or radiculopathy, lumbar region: Secondary | ICD-10-CM | POA: Diagnosis not present

## 2022-05-06 DIAGNOSIS — M5416 Radiculopathy, lumbar region: Secondary | ICD-10-CM | POA: Diagnosis not present

## 2022-05-06 DIAGNOSIS — E7849 Other hyperlipidemia: Secondary | ICD-10-CM | POA: Diagnosis not present

## 2022-05-06 DIAGNOSIS — M47814 Spondylosis without myelopathy or radiculopathy, thoracic region: Secondary | ICD-10-CM | POA: Diagnosis not present

## 2022-05-06 DIAGNOSIS — I714 Abdominal aortic aneurysm, without rupture, unspecified: Secondary | ICD-10-CM | POA: Diagnosis not present

## 2022-09-07 ENCOUNTER — Ambulatory Visit: Payer: 59 | Attending: Cardiology | Admitting: Cardiology

## 2022-09-07 ENCOUNTER — Encounter: Payer: Self-pay | Admitting: Cardiology

## 2022-09-07 VITALS — BP 138/82 | HR 77 | Ht 73.0 in | Wt 244.2 lb

## 2022-09-07 DIAGNOSIS — I739 Peripheral vascular disease, unspecified: Secondary | ICD-10-CM | POA: Diagnosis not present

## 2022-09-07 DIAGNOSIS — I10 Essential (primary) hypertension: Secondary | ICD-10-CM | POA: Diagnosis not present

## 2022-09-07 DIAGNOSIS — E782 Mixed hyperlipidemia: Secondary | ICD-10-CM | POA: Diagnosis not present

## 2022-09-07 DIAGNOSIS — I350 Nonrheumatic aortic (valve) stenosis: Secondary | ICD-10-CM | POA: Diagnosis not present

## 2022-09-07 NOTE — Progress Notes (Signed)
Clinical Summary Gerald Mccann is a 61 y.o.male seen today for follow up of the following medical problems.   1.Aortic stenosis - Echocardiogram on 03/10/2017 demonstrated normal LV systolic function, LVEF 60 to 65%, moderate LVH, mild to moderate aortic stenosis, and mild aortic root and proximal ascending aortic dilatation, both of which measured 37 mm.   -12/2020 echo: LVEF 65-70%, grade I dd, normal RV, Mod AS mean grade 22 AVA VTI 1.32 - no recent SOB/DOE, no chest pain    03/2022 echo: LVEF 65-70%, no WMAs, grade I dd, mod AS mean grad 25 AVA VTI 1.11 - no recent SOB/DOE, no chest pains    2. HTN  - home bp's 120s-130s/80s - compliant with meds       3.PAD - 03/2021 ABI normal right, mild left 0.82 - 03/2021 arterial US right occluded ATA, normal left - seen by vascular 04/2021, recs to monitor at this time.F/u just as needed   - slight pains at times, but limiting. Has improved over time.    6. Hyperlipidemia - reports recent labs with his pain doctor Dr Carney Bern in [place] - he is on pravastatin 12/2021 TC 167 TG 252 HDL 44 LDL 73    At 64 will need AAA [place]     Enjoys riding his motorcycle, looking to sell his bike. His son had recent accident. Raising 2 grandchilder ages 47 and 3. Has one great grandbaby.    Past Medical History:  Diagnosis Date   Arthrosis of left acromioclavicular joint    Chronic back pain    Complete rotator cuff tear of left shoulder    Current every day smoker    smokes a pack and a half a day   Hiatal hernia    HTN (hypertension)    Impingement syndrome of left shoulder    Pain management contract discussed    post op pain meds to be managed by [facility] who currently manages his pain meds     No Known Allergies   Current Outpatient Medications  Medication Sig Dispense Refill   amLODipine (NORVASC) 10 MG tablet TAKE ONE TABLET BY MOUTH DAILY. 30 tablet 0   aspirin 81 MG tablet Take 81 mg by mouth daily.      Blood Pressure Monitor MISC Use as directed 1 each 0   busPIRone (BUSPAR) 5 MG tablet Take 5 mg by mouth 2 (two) times daily as needed. for anxiety  2   carvedilol (COREG) 3.125 MG tablet Take 3.125 mg by mouth 2 (two) times daily with a meal.     citalopram (CELEXA) 40 MG tablet Take 40 mg by mouth daily.  0   cyclobenzaprine (FLEXERIL) 10 MG tablet Take 10 mg by mouth 2 (two) times daily.     diclofenac sodium (VOLTAREN) 1 % GEL Apply 1 application topically 4 (four) times daily as needed.     fenofibrate (TRICOR) 48 MG tablet Take 48 mg by mouth daily.     fluticasone (FLONASE) 50 MCG/ACT nasal spray Place 2 sprays into both nostrils daily.     gabapentin (NEURONTIN) 300 MG capsule Take 300 mg by mouth at bedtime.     metFORMIN (GLUCOPHAGE-XR) 500 MG 24 hr tablet Take 1,000 mg by mouth daily.     methocarbamol (ROBAXIN) 750 MG tablet Take 1 tablet by mouth 2 (two) times daily as needed.     olmesartan (BENICAR) 40 MG tablet Take 40 mg by mouth daily.  omeprazole (PRILOSEC) 40 MG capsule Take 1 capsule by mouth daily.     oxyCODONE-acetaminophen (PERCOCET) 10-325 MG tablet TAKE ONE TABLET BY MOUTH EVERY 4 TO 6 HOURS AS NEEDED.  0   pravastatin (PRAVACHOL) 20 MG tablet Take 20 mg by mouth daily.     RYBELSUS 3 MG TABS Take 1 tablet by mouth daily.     TRELEGY ELLIPTA 100-62.5-25 MCG/ACT AEPB Take 1 puff by mouth daily.     VENTOLIN HFA 108 (90 Base) MCG/ACT inhaler Inhale 2 puffs into the lungs every 4 (four) hours as needed.     No current facility-administered medications for this visit.     Past Surgical History:  Procedure Laterality Date   right knee surgery       No Known Allergies    Family History  Problem Relation Age of Onset   Coronary artery disease Mother    Coronary artery disease Father    Heart attack Father    Heart attack Brother 26       deceased   Asthma Other      Social History Gerald Mccann reports that he has been smoking cigarettes. He started  smoking about 50 years ago. He has been smoking an average of 1.5 packs per day. He has never been exposed to tobacco smoke. He has never used smokeless tobacco. Gerald Mccann reports current alcohol use.   Review of Systems CONSTITUTIONAL: No weight loss, fever, chills, weakness or fatigue.  HEENT: Eyes: No visual loss, blurred vision, double vision or yellow sclerae.No hearing loss, sneezing, congestion, runny nose or sore throat.  SKIN: No rash or itching.  CARDIOVASCULAR: per hpi RESPIRATORY: No shortness of breath, cough or sputum.  GASTROINTESTINAL: No anorexia, nausea, vomiting or diarrhea. No abdominal pain or blood.  GENITOURINARY: No burning on urination, no polyuria NEUROLOGICAL: No headache, dizziness, syncope, paralysis, ataxia, numbness or tingling in the extremities. No change in bowel or bladder control.  MUSCULOSKELETAL: No muscle, back pain, joint pain or stiffness.  LYMPHATICS: No enlarged nodes. No history of splenectomy.  PSYCHIATRIC: No history of depression or anxiety.  ENDOCRINOLOGIC: No reports of sweating, cold or heat intolerance. No polyuria or polydipsia.  Marland Kitchen   Physical Examination Today's Vitals   09/07/22 0857 09/07/22 0937  BP: (!) 128/92 138/82  Pulse: 77   SpO2: 96%   Weight: 244 lb 3.2 oz (110.8 kg)   Height: 6\' 1"  (1.854 m)    Body mass index is 32.22 kg/m.  Gen: resting comfortably, no acute distress HEENT: no scleral icterus, pupils equal round and reactive, no palptable cervical adenopathy,  CV: RRR, no m/r/g no jvd Resp: Clear to auscultation bilaterally GI: abdomen is soft, non-tender, non-distended, normal bowel sounds, no hepatosplenomegaly MSK: extremities are warm, no edema.  Skin: warm, no rash Neuro:  no focal deficits Psych: appropriate affect   Diagnostic Studies  12/2020 echo  1. Left ventricular ejection fraction, by estimation, is 65 to 70%. The  left ventricle has normal function. The left ventricle has no regional  wall  motion abnormalities. Left ventricular diastolic parameters are  consistent with Grade I diastolic  dysfunction (impaired relaxation). The average left ventricular global  longitudinal strain is -17.2 %. The global longitudinal strain is normal.   2. Right ventricular systolic function is normal. The right ventricular  size is normal.   3. The mitral valve is normal in structure. No evidence of mitral valve  regurgitation. No evidence of mitral stenosis.   4. The aortic valve  is tricuspid. There is moderate calcification of the  aortic valve. There is moderate thickening of the aortic valve. Aortic  valve regurgitation is not visualized. Moderate aortic valve stenosis.  Aortic valve mean gradient measures  22.0 mmHg. Aortic valve peak gradient measures 41.2 mmHg. Aortic valve  area, by VTI measures 1.32 cm.   5. The inferior vena cava is normal in size with greater than 50%  respiratory variability, suggesting right atrial pressure of 3 mmHg.      03/2021 LE arterial US Summary:  Right: Total occlusion noted in the anterior tibial artery. Mid ATA  segment.      Left: No obstructive disease.      03/2021 ABI Summary:  Right: Resting right ankle-brachial index is within normal range. No  evidence of significant right lower extremity arterial disease. The right  toe-brachial index is abnormal.   Left: Resting left ankle-brachial index indicates mild left lower  extremity arterial disease. The left toe-brachial index is abnormal        03/2022 echo 1. Left ventricular ejection fraction, by estimation, is 65 to 70%. The  left ventricle has normal function. Left ventricular endocardial border  not optimally defined to evaluate regional wall motion. There is mild left  ventricular hypertrophy. Left  ventricular diastolic parameters are consistent with Grade I diastolic  dysfunction (impaired relaxation).   2. Right ventricular systolic function is normal. The right ventricular   size is normal. There is normal pulmonary artery systolic pressure. The  estimated right ventricular systolic pressure is 86.5 mmHg.   3. The mitral valve is grossly normal. Trivial mitral valve  regurgitation.   4. The aortic valve is probably functionally bicuspid. There is moderate  calcification of the aortic valve. Aortic valve regurgitation is not  visualized. Moderate to severe aortic valve stenosis. Aortic valve area,  by VTI measures 1.11 cm. Aortic valve   mean gradient measures 24.6 mmHg. Dimentionless index 0.29.   5. The inferior vena cava is normal in size with greater than 50%  respiratory variability, suggesting right atrial pressure of 3 mmHg.      Assessment and Plan   1.Aortic stenosis - moderate AS, asymptomatic - we will repeat echo net year   2. HTN - elevated here but home numbers are consistently at goal, contiue current meds   3. Hyperlipidemia - LDL essentially at goal, discussed dietary changes to improve TGs   4. PAD - mild nonlimiting symptoms - we will continue medical therapy. Can refer back to vascular in the future if significant progression.    F/u 6 months       Arnoldo Lenis, M.D.

## 2022-09-07 NOTE — Patient Instructions (Signed)

## 2022-12-02 DIAGNOSIS — K219 Gastro-esophageal reflux disease without esophagitis: Secondary | ICD-10-CM | POA: Diagnosis not present

## 2022-12-02 DIAGNOSIS — I10 Essential (primary) hypertension: Secondary | ICD-10-CM | POA: Diagnosis not present

## 2022-12-02 DIAGNOSIS — R7303 Prediabetes: Secondary | ICD-10-CM | POA: Diagnosis not present

## 2022-12-02 DIAGNOSIS — M47816 Spondylosis without myelopathy or radiculopathy, lumbar region: Secondary | ICD-10-CM | POA: Diagnosis not present

## 2022-12-02 DIAGNOSIS — F419 Anxiety disorder, unspecified: Secondary | ICD-10-CM | POA: Diagnosis not present

## 2022-12-02 DIAGNOSIS — E7849 Other hyperlipidemia: Secondary | ICD-10-CM | POA: Diagnosis not present

## 2022-12-02 DIAGNOSIS — I714 Abdominal aortic aneurysm, without rupture, unspecified: Secondary | ICD-10-CM | POA: Diagnosis not present

## 2022-12-02 DIAGNOSIS — G894 Chronic pain syndrome: Secondary | ICD-10-CM | POA: Diagnosis not present

## 2022-12-02 DIAGNOSIS — M47814 Spondylosis without myelopathy or radiculopathy, thoracic region: Secondary | ICD-10-CM | POA: Diagnosis not present

## 2023-02-08 DIAGNOSIS — R1310 Dysphagia, unspecified: Secondary | ICD-10-CM | POA: Diagnosis not present

## 2023-02-08 DIAGNOSIS — R059 Cough, unspecified: Secondary | ICD-10-CM | POA: Diagnosis not present

## 2023-02-08 DIAGNOSIS — J441 Chronic obstructive pulmonary disease with (acute) exacerbation: Secondary | ICD-10-CM | POA: Diagnosis not present

## 2023-02-08 DIAGNOSIS — R030 Elevated blood-pressure reading, without diagnosis of hypertension: Secondary | ICD-10-CM | POA: Diagnosis not present

## 2023-02-08 DIAGNOSIS — F17210 Nicotine dependence, cigarettes, uncomplicated: Secondary | ICD-10-CM | POA: Diagnosis not present

## 2023-02-08 DIAGNOSIS — Z6833 Body mass index (BMI) 33.0-33.9, adult: Secondary | ICD-10-CM | POA: Diagnosis not present

## 2023-02-22 NOTE — Progress Notes (Unsigned)
Referring Provider:   Juliette Alcide, MD Primary Care Physician:  Juliette Alcide, MD Primary Gastroenterologist:  Dr. Jena Gauss  No chief complaint on file.   HPI:   Gerald Mccann is a 62 y.o. male presenting today at the request of   Burdine, Ananias Pilgrim, MD for dysphagia.      Moderate to severe AS.   Past Medical History:  Diagnosis Date   Arthrosis of left acromioclavicular joint    Chronic back pain    Complete rotator cuff tear of left shoulder    Current every day smoker    smokes a pack and a half a day   Hiatal hernia    HTN (hypertension)    Impingement syndrome of left shoulder    Pain management contract discussed    post op pain meds to be managed by [facility] who currently manages his pain meds    Past Surgical History:  Procedure Laterality Date   right knee surgery      Current Outpatient Medications  Medication Sig Dispense Refill   amLODipine (NORVASC) 10 MG tablet TAKE ONE TABLET BY MOUTH DAILY. 30 tablet 0   aspirin 81 MG tablet Take 81 mg by mouth daily.     Blood Pressure Monitor MISC Use as directed 1 each 0   busPIRone (BUSPAR) 5 MG tablet Take 5 mg by mouth 2 (two) times daily as needed. for anxiety  2   carvedilol (COREG) 3.125 MG tablet Take 3.125 mg by mouth 2 (two) times daily with a meal.     citalopram (CELEXA) 40 MG tablet Take 40 mg by mouth daily.  0   cyclobenzaprine (FLEXERIL) 10 MG tablet Take 10 mg by mouth 2 (two) times daily.     diclofenac sodium (VOLTAREN) 1 % GEL Apply 1 application topically 4 (four) times daily as needed.     fenofibrate (TRICOR) 48 MG tablet Take 48 mg by mouth daily.     fluticasone (FLONASE) 50 MCG/ACT nasal spray Place 2 sprays into both nostrils daily.     gabapentin (NEURONTIN) 300 MG capsule Take 300 mg by mouth at bedtime.     metFORMIN (GLUCOPHAGE-XR) 500 MG 24 hr tablet Take 1,000 mg by mouth daily.     methocarbamol (ROBAXIN) 750 MG tablet Take 1 tablet by mouth 2 (two) times  daily as needed.     olmesartan (BENICAR) 40 MG tablet Take 40 mg by mouth daily.     omeprazole (PRILOSEC) 40 MG capsule Take 1 capsule by mouth daily.     oxyCODONE-acetaminophen (PERCOCET) 10-325 MG tablet TAKE ONE TABLET BY MOUTH EVERY 4 TO 6 HOURS AS NEEDED.  0   pravastatin (PRAVACHOL) 20 MG tablet Take 20 mg by mouth daily.     RYBELSUS 3 MG TABS Take 1 tablet by mouth daily.     TRELEGY ELLIPTA 100-62.5-25 MCG/ACT AEPB Take 1 puff by mouth daily.     VENTOLIN HFA 108 (90 Base) MCG/ACT inhaler Inhale 2 puffs into the lungs every 4 (four) hours as needed.     No current facility-administered medications for this visit.    Allergies as of 02/24/2023   (No Known Allergies)    Family History  Problem Relation Age of Onset   Coronary artery disease Mother    Coronary artery disease Father    Heart attack Father    Heart attack Brother 13       deceased   Asthma Other  Social History   Socioeconomic History   Marital status: Married    Spouse name: Not on file   Number of children: 5   Years of education: Not on file   Highest education level: Not on file  Occupational History   Not on file  Tobacco Use   Smoking status: Every Day    Packs/day: 1.5    Types: Cigarettes    Start date: 09/01/1972    Passive exposure: Never   Smokeless tobacco: Never  Vaping Use   Vaping Use: Never used  Substance and Sexual Activity   Alcohol use: Yes    Alcohol/week: 0.0 standard drinks of alcohol    Comment: 1 to 2 beers q month   Drug use: No   Sexual activity: Not on file  Other Topics Concern   Not on file  Social History Narrative   Not on file   Social Determinants of Health   Financial Resource Strain: Not on file  Food Insecurity: Not on file  Transportation Needs: Not on file  Physical Activity: Not on file  Stress: Not on file  Social Connections: Not on file  Intimate Partner Violence: Not on file    Review of Systems: Gen: Denies any fever, chills,  fatigue, weight loss, lack of appetite.  CV: Denies chest pain, heart palpitations, peripheral edema, syncope.  Resp: Denies shortness of breath at rest or with exertion. Denies wheezing or cough.  GI: Denies dysphagia or odynophagia. Denies jaundice, hematemesis, fecal incontinence. GU : Denies urinary burning, urinary frequency, urinary hesitancy MS: Denies joint pain, muscle weakness, cramps, or limitation of movement.  Derm: Denies rash, itching, dry skin Psych: Denies depression, anxiety, memory loss, and confusion Heme: Denies bruising, bleeding, and enlarged lymph nodes.  Physical Exam: There were no vitals taken for this visit. General:   Alert and oriented. Pleasant and cooperative. Well-nourished and well-developed.  Head:  Normocephalic and atraumatic. Eyes:  Without icterus, sclera clear and conjunctiva pink.  Ears:  Normal auditory acuity. Lungs:  Clear to auscultation bilaterally. No wheezes, rales, or rhonchi. No distress.  Heart:  S1, S2 present without murmurs appreciated.  Abdomen:  +BS, soft, non-tender and non-distended. No HSM noted. No guarding or rebound. No masses appreciated.  Rectal:  Deferred  Msk:  Symmetrical without gross deformities. Normal posture. Extremities:  Without edema. Neurologic:  Alert and  oriented x4;  grossly normal neurologically. Skin:  Intact without significant lesions or rashes. Psych:  Alert and cooperative. Normal mood and affect.    Assessment:     Plan:  ***   Ermalinda Memos, PA-C [facility] 02/24/2023

## 2023-02-22 NOTE — H&P (View-Only) (Signed)
 Referring Provider:   Burdine, Steven E, MD Primary Care Physician:  Burdine, Steven E, MD Primary Gastroenterologist:  Dr. Rourk  No chief complaint on file.   HPI:   Gerald Mccann is a 61 y.o. male presenting today at the request of   Burdine, Steven E, MD for dysphagia.      Moderate to severe AS.   Past Medical History:  Diagnosis Date   Arthrosis of left acromioclavicular joint    Chronic back pain    Complete rotator cuff tear of left shoulder    Current every day smoker    smokes a pack and a half a day   Hiatal hernia    HTN (hypertension)    Impingement syndrome of left shoulder    Pain management contract discussed    post op pain meds to be managed by [facility] who currently manages his pain meds    Past Surgical History:  Procedure Laterality Date   right knee surgery      Current Outpatient Medications  Medication Sig Dispense Refill   amLODipine (NORVASC) 10 MG tablet TAKE ONE TABLET BY MOUTH DAILY. 30 tablet 0   aspirin 81 MG tablet Take 81 mg by mouth daily.     Blood Pressure Monitor MISC Use as directed 1 each 0   busPIRone (BUSPAR) 5 MG tablet Take 5 mg by mouth 2 (two) times daily as needed. for anxiety  2   carvedilol (COREG) 3.125 MG tablet Take 3.125 mg by mouth 2 (two) times daily with a meal.     citalopram (CELEXA) 40 MG tablet Take 40 mg by mouth daily.  0   cyclobenzaprine (FLEXERIL) 10 MG tablet Take 10 mg by mouth 2 (two) times daily.     diclofenac sodium (VOLTAREN) 1 % GEL Apply 1 application topically 4 (four) times daily as needed.     fenofibrate (TRICOR) 48 MG tablet Take 48 mg by mouth daily.     fluticasone (FLONASE) 50 MCG/ACT nasal spray Place 2 sprays into both nostrils daily.     gabapentin (NEURONTIN) 300 MG capsule Take 300 mg by mouth at bedtime.     metFORMIN (GLUCOPHAGE-XR) 500 MG 24 hr tablet Take 1,000 mg by mouth daily.     methocarbamol (ROBAXIN) 750 MG tablet Take 1 tablet by mouth 2 (two) times  daily as needed.     olmesartan (BENICAR) 40 MG tablet Take 40 mg by mouth daily.     omeprazole (PRILOSEC) 40 MG capsule Take 1 capsule by mouth daily.     oxyCODONE-acetaminophen (PERCOCET) 10-325 MG tablet TAKE ONE TABLET BY MOUTH EVERY 4 TO 6 HOURS AS NEEDED.  0   pravastatin (PRAVACHOL) 20 MG tablet Take 20 mg by mouth daily.     RYBELSUS 3 MG TABS Take 1 tablet by mouth daily.     TRELEGY ELLIPTA 100-62.5-25 MCG/ACT AEPB Take 1 puff by mouth daily.     VENTOLIN HFA 108 (90 Base) MCG/ACT inhaler Inhale 2 puffs into the lungs every 4 (four) hours as needed.     No current facility-administered medications for this visit.    Allergies as of 02/24/2023   (No Known Allergies)    Family History  Problem Relation Age of Onset   Coronary artery disease Mother    Coronary artery disease Father    Heart attack Father    Heart attack Brother 48       deceased   Asthma Other       Social History   Socioeconomic History   Marital status: Married    Spouse name: Not on file   Number of children: 5   Years of education: Not on file   Highest education level: Not on file  Occupational History   Not on file  Tobacco Use   Smoking status: Every Day    Packs/day: 1.5    Types: Cigarettes    Start date: 09/01/1972    Passive exposure: Never   Smokeless tobacco: Never  Vaping Use   Vaping Use: Never used  Substance and Sexual Activity   Alcohol use: Yes    Alcohol/week: 0.0 standard drinks of alcohol    Comment: 1 to 2 beers q month   Drug use: No   Sexual activity: Not on file  Other Topics Concern   Not on file  Social History Narrative   Not on file   Social Determinants of Health   Financial Resource Strain: Not on file  Food Insecurity: Not on file  Transportation Needs: Not on file  Physical Activity: Not on file  Stress: Not on file  Social Connections: Not on file  Intimate Partner Violence: Not on file    Review of Systems: Gen: Denies any fever, chills,  fatigue, weight loss, lack of appetite.  CV: Denies chest pain, heart palpitations, peripheral edema, syncope.  Resp: Denies shortness of breath at rest or with exertion. Denies wheezing or cough.  GI: Denies dysphagia or odynophagia. Denies jaundice, hematemesis, fecal incontinence. GU : Denies urinary burning, urinary frequency, urinary hesitancy MS: Denies joint pain, muscle weakness, cramps, or limitation of movement.  Derm: Denies rash, itching, dry skin Psych: Denies depression, anxiety, memory loss, and confusion Heme: Denies bruising, bleeding, and enlarged lymph nodes.  Physical Exam: There were no vitals taken for this visit. General:   Alert and oriented. Pleasant and cooperative. Well-nourished and well-developed.  Head:  Normocephalic and atraumatic. Eyes:  Without icterus, sclera clear and conjunctiva pink.  Ears:  Normal auditory acuity. Lungs:  Clear to auscultation bilaterally. No wheezes, rales, or rhonchi. No distress.  Heart:  S1, S2 present without murmurs appreciated.  Abdomen:  +BS, soft, non-tender and non-distended. No HSM noted. No guarding or rebound. No masses appreciated.  Rectal:  Deferred  Msk:  Symmetrical without gross deformities. Normal posture. Extremities:  Without edema. Neurologic:  Alert and  oriented x4;  grossly normal neurologically. Skin:  Intact without significant lesions or rashes. Psych:  Alert and cooperative. Normal mood and affect.    Assessment:     Plan:  ***   [name], PA-C [facility] 02/24/2023   

## 2023-02-24 ENCOUNTER — Encounter: Payer: Self-pay | Admitting: *Deleted

## 2023-02-24 ENCOUNTER — Ambulatory Visit (INDEPENDENT_AMBULATORY_CARE_PROVIDER_SITE_OTHER): Payer: Medicare HMO | Admitting: Gastroenterology

## 2023-02-24 ENCOUNTER — Telehealth: Payer: Self-pay | Admitting: *Deleted

## 2023-02-24 ENCOUNTER — Encounter: Payer: Self-pay | Admitting: Gastroenterology

## 2023-02-24 VITALS — BP 113/76 | HR 84 | Temp 97.7°F | Ht 73.0 in | Wt 248.4 lb

## 2023-02-24 DIAGNOSIS — R1310 Dysphagia, unspecified: Secondary | ICD-10-CM

## 2023-02-24 DIAGNOSIS — Z1211 Encounter for screening for malignant neoplasm of colon: Secondary | ICD-10-CM | POA: Diagnosis not present

## 2023-02-24 NOTE — Telephone Encounter (Signed)
Authorization #[id number], DOS: 03/21/2023 - 05/21/2023

## 2023-02-24 NOTE — Patient Instructions (Signed)
We will arrange to have an upper and with possible stretching of your esophagus and colonoscopy in the near future with Dr. Marletta Lor. You will need to hold Rybelsus for 24 hours before your procedures. You can take metformin as prescribed the day prior to your procedures. Day of your procedures, do not take any morning diabetes medications.  Swallowing precautions:  Eat slowly, take small bites, chew thoroughly, drink plenty of liquids throughout meals.  Avoid trough textures All meats should be chopped finely.  If something gets hung in your esophagus and will not come up or go down, proceed to the emergency room.     Will follow-up with you in the office after your procedures. It was nice to meet you today!   Ermalinda Memos, PA-C [facility]

## 2023-02-25 ENCOUNTER — Encounter: Payer: Self-pay | Admitting: Gastroenterology

## 2023-03-14 ENCOUNTER — Encounter: Payer: Self-pay | Admitting: Cardiology

## 2023-03-14 ENCOUNTER — Telehealth: Payer: Self-pay | Admitting: *Deleted

## 2023-03-14 ENCOUNTER — Ambulatory Visit: Payer: Medicare HMO | Attending: Cardiology | Admitting: Cardiology

## 2023-03-14 VITALS — BP 124/80 | HR 80 | Ht 73.0 in | Wt 249.8 lb

## 2023-03-14 DIAGNOSIS — E782 Mixed hyperlipidemia: Secondary | ICD-10-CM

## 2023-03-14 DIAGNOSIS — I10 Essential (primary) hypertension: Secondary | ICD-10-CM | POA: Diagnosis not present

## 2023-03-14 DIAGNOSIS — I739 Peripheral vascular disease, unspecified: Secondary | ICD-10-CM

## 2023-03-14 DIAGNOSIS — E1151 Type 2 diabetes mellitus with diabetic peripheral angiopathy without gangrene: Secondary | ICD-10-CM | POA: Diagnosis not present

## 2023-03-14 DIAGNOSIS — I350 Nonrheumatic aortic (valve) stenosis: Secondary | ICD-10-CM | POA: Diagnosis not present

## 2023-03-14 NOTE — Telephone Encounter (Signed)
Noted  

## 2023-03-14 NOTE — Patient Instructions (Signed)
Medication Instructions:  Continue all current medications.   Labwork: none  Testing/Procedures: none  Follow-Up: 6 months   Any Other Special Instructions Will Be Listed Below (If Applicable).   If you need a refill on your cardiac medications before your next appointment, please call your pharmacy.  

## 2023-03-14 NOTE — Progress Notes (Signed)
Clinical Summary Mr. Dworak is a 62 y.o.male seen today for follow up of the following medical problems.    1.Aortic stenosis - Echocardiogram on 03/10/2017 demonstrated normal LV systolic function, LVEF 60 to 16%, moderate LVH, mild to moderate aortic stenosis, and mild aortic root and proximal ascending aortic dilatation, both of which measured 37 mm.   -12/2020 echo: LVEF 65-70%, grade I dd, normal RV, Mod AS mean grade 22 AVA VTI 1.32     03/2022 echo: LVEF 65-70%, no WMAs, grade I dd, mod AS mean grad 25 AVA VTI 1.11 - no SOB,DOE, no chest pains      2. HTN  - home bp's 120s/80s - he is compliant with meds       3.PAD - 03/2021 ABI normal right, mild left 0.82 - 03/2021 arterial US right occluded ATA, normal left - seen by vascular 04/2021, recs to monitor at this time.F/u just as needed   - ongoing leg pains at times with walking, no limiting - not interested in SET program    6. Hyperlipidemia - reports recent labs with his pain doctor Dr Thompson Grayer in [place] - he is on pravastatin 12/2021 TC 167 TG 252 HDL 44 LDL 73 08/2022 TC 128 HDL 44 TG 128 LDL 58       At 64 will need AAA [place]     Enjoys riding his motorcycle, looking to sell his bike. His son had recent accident. Raising 2 grandchilder ages 12 and 76. Has one great grandbaby.  Past Medical History:  Diagnosis Date   Aortic stenosis    Arthrosis of left acromioclavicular joint    Chronic back pain    Complete rotator cuff tear of left shoulder    COPD (chronic obstructive pulmonary disease) (HCC)    Current every day smoker    smokes a pack and a half a day   Diabetes (HCC)    Hiatal hernia    HLD (hyperlipidemia)    HTN (hypertension)    Impingement syndrome of left shoulder    Pain management contract discussed    post op pain meds to be managed by [facility] who currently manages his pain meds     No Known Allergies   Current Outpatient Medications  Medication Sig Dispense  Refill   amLODipine (NORVASC) 10 MG tablet TAKE ONE TABLET BY MOUTH DAILY. 30 tablet 0   aspirin 81 MG tablet Take 81 mg by mouth daily.     Blood Pressure Monitor MISC Use as directed 1 each 0   busPIRone (BUSPAR) 5 MG tablet Take 5 mg by mouth 2 (two) times daily as needed. for anxiety  2   carvedilol (COREG) 3.125 MG tablet Take 3.125 mg by mouth 2 (two) times daily with a meal.     citalopram (CELEXA) 40 MG tablet Take 40 mg by mouth daily.  0   cyclobenzaprine (FLEXERIL) 10 MG tablet Take 10 mg by mouth 2 (two) times daily.     diclofenac sodium (VOLTAREN) 1 % GEL Apply 1 application topically 4 (four) times daily as needed.     fenofibrate (TRICOR) 48 MG tablet Take 48 mg by mouth daily.     fluticasone (FLONASE) 50 MCG/ACT nasal spray Place 2 sprays into both nostrils daily. (Patient not taking: Reported on 02/24/2023)     gabapentin (NEURONTIN) 300 MG capsule Take 300 mg by mouth at bedtime.     metFORMIN (GLUCOPHAGE-XR) 500 MG 24 hr tablet Take 1,000  mg by mouth daily.     methocarbamol (ROBAXIN) 750 MG tablet Take 1 tablet by mouth 2 (two) times daily as needed. (Patient not taking: Reported on 02/24/2023)     olmesartan (BENICAR) 40 MG tablet Take 40 mg by mouth daily.     omeprazole (PRILOSEC) 40 MG capsule Take 1 capsule by mouth daily.     oxyCODONE-acetaminophen (PERCOCET) 10-325 MG tablet TAKE ONE TABLET BY MOUTH EVERY 4 TO 6 HOURS AS NEEDED.  0   pravastatin (PRAVACHOL) 20 MG tablet Take 20 mg by mouth daily.     RYBELSUS 3 MG TABS Take 1 tablet by mouth daily.     TRELEGY ELLIPTA 100-62.5-25 MCG/ACT AEPB Take 1 puff by mouth daily.     VENTOLIN HFA 108 (90 Base) MCG/ACT inhaler Inhale 2 puffs into the lungs every 4 (four) hours as needed. (Patient not taking: Reported on 02/24/2023)     No current facility-administered medications for this visit.     Past Surgical History:  Procedure Laterality Date   right knee surgery       No Known Allergies    Family History   Problem Relation Age of Onset   Coronary artery disease Mother    Coronary artery disease Father    Heart attack Father    Heart attack Brother 104       deceased   Asthma Other      Social History Mr. Radakovich reports that he has been smoking cigarettes. He started smoking about 50 years ago. He has been smoking an average of 1.5 packs per day. He has never been exposed to tobacco smoke. He has never used smokeless tobacco. Mr. Eskra reports current alcohol use.   Review of Systems CONSTITUTIONAL: No weight loss, fever, chills, weakness or fatigue.  HEENT: Eyes: No visual loss, blurred vision, double vision or yellow sclerae.No hearing loss, sneezing, congestion, runny nose or sore throat.  SKIN: No rash or itching.  CARDIOVASCULAR: per hpi RESPIRATORY: No shortness of breath, cough or sputum.  GASTROINTESTINAL: No anorexia, nausea, vomiting or diarrhea. No abdominal pain or blood.  GENITOURINARY: No burning on urination, no polyuria NEUROLOGICAL: No headache, dizziness, syncope, paralysis, ataxia, numbness or tingling in the extremities. No change in bowel or bladder control.  MUSCULOSKELETAL: No muscle, back pain, joint pain or stiffness.  LYMPHATICS: No enlarged nodes. No history of splenectomy.  PSYCHIATRIC: No history of depression or anxiety.  ENDOCRINOLOGIC: No reports of sweating, cold or heat intolerance. No polyuria or polydipsia.  Marland Kitchen   Physical Examination Today's Vitals   03/14/23 1121  BP: 124/80  Pulse: 80  SpO2: 97%  Weight: 249 lb 12.8 oz (113.3 kg)  Height: 6\' 1"  (1.854 m)   Body mass index is 32.96 kg/m.  Gen: resting comfortably, no acute distress HEENT: no scleral icterus, pupils equal round and reactive, no palptable cervical adenopathy,  CV: RRR, 3/6 systolic murmur rusb, no jvd Resp: Clear to auscultation bilaterally GI: abdomen is soft, non-tender, non-distended, normal bowel sounds, no hepatosplenomegaly MSK: extremities are warm, no edema.   Skin: warm, no rash Neuro:  no focal deficits Psych: appropriate affect   Diagnostic Studies  12/2020 echo  1. Left ventricular ejection fraction, by estimation, is 65 to 70%. The  left ventricle has normal function. The left ventricle has no regional  wall motion abnormalities. Left ventricular diastolic parameters are  consistent with Grade I diastolic  dysfunction (impaired relaxation). The average left ventricular global  longitudinal strain is -17.2 %. The  global longitudinal strain is normal.   2. Right ventricular systolic function is normal. The right ventricular  size is normal.   3. The mitral valve is normal in structure. No evidence of mitral valve  regurgitation. No evidence of mitral stenosis.   4. The aortic valve is tricuspid. There is moderate calcification of the  aortic valve. There is moderate thickening of the aortic valve. Aortic  valve regurgitation is not visualized. Moderate aortic valve stenosis.  Aortic valve mean gradient measures  22.0 mmHg. Aortic valve peak gradient measures 41.2 mmHg. Aortic valve  area, by VTI measures 1.32 cm.   5. The inferior vena cava is normal in size with greater than 50%  respiratory variability, suggesting right atrial pressure of 3 mmHg.      03/2021 LE arterial US Summary:  Right: Total occlusion noted in the anterior tibial artery. Mid ATA  segment.      Left: No obstructive disease.      03/2021 ABI Summary:  Right: Resting right ankle-brachial index is within normal range. No  evidence of significant right lower extremity arterial disease. The right  toe-brachial index is abnormal.   Left: Resting left ankle-brachial index indicates mild left lower  extremity arterial disease. The left toe-brachial index is abnormal        03/2022 echo 1. Left ventricular ejection fraction, by estimation, is 65 to 70%. The  left ventricle has normal function. Left ventricular endocardial border  not optimally defined to  evaluate regional wall motion. There is mild left  ventricular hypertrophy. Left  ventricular diastolic parameters are consistent with Grade I diastolic  dysfunction (impaired relaxation).   2. Right ventricular systolic function is normal. The right ventricular  size is normal. There is normal pulmonary artery systolic pressure. The  estimated right ventricular systolic pressure is 18.7 mmHg.   3. The mitral valve is grossly normal. Trivial mitral valve  regurgitation.   4. The aortic valve is probably functionally bicuspid. There is moderate  calcification of the aortic valve. Aortic valve regurgitation is not  visualized. Moderate to severe aortic valve stenosis. Aortic valve area,  by VTI measures 1.11 cm. Aortic valve   mean gradient measures 24.6 mmHg. Dimentionless index 0.29.   5. The inferior vena cava is normal in size with greater than 50%  respiratory variability, suggesting right atrial pressure of 3 mmHg.      Assessment and Plan  1.Aortic stenosis - moderate AS, asymptomatic -he has repeat echo scheduled for June   2. HTN - at goal, cotninue current meds   3. Hyperlipidemia - at goal, continue current meds   4. PAD - mild nonlimiting symptoms. Seen vascular, see prn if symptoms were to significantly progress - he is not interested in referral to SET program - continue medical therapy.       Antoine Poche, M.D.

## 2023-03-14 NOTE — Telephone Encounter (Signed)
Pt called in. He wants to cancel his colonoscopy 5/20 but he wants to have the EGD done. He reports 'I just don't want the colonoscopy done period". Advised will make provider aware. FYI.

## 2023-03-15 NOTE — Patient Instructions (Signed)
Gerald Mccann  03/15/2023     @PREFPERIOPPHARMACY @   Your procedure is scheduled on  03/21/2023.   Report to [facility] at  1130 A.M.   Call this number if you have problems the morning of surgery:  [phone]  If you experience any cold or flu symptoms such as cough, fever, chills, shortness of breath, etc. between now and your scheduled surgery, please notify us at the above number.   Remember:  Follow the diet instructions given to you by the office.       Your last dose of rybelsus should be on 03/19/2023.        DO NOT take any medications for diabetes the morning of your procedure.     Take these medicines the morning of surgery with A SIP OF WATER           amlodipine, buspar, carvedilol, celexa, omeprazole, oxycodone(if needed).     Do not wear jewelry, make-up or nail polish.  Do not wear lotions, powders, or perfumes, or deodorant.  Do not shave 48 hours prior to surgery.  Men may shave face and neck.  Do not bring valuables to the hospital.  [facility] is not responsible for any belongings or valuables.  Contacts, dentures or bridgework may not be worn into surgery.  Leave your suitcase in the car.  After surgery it may be brought to your room.  For patients admitted to the hospital, discharge time will be determined by your treatment team.  Patients discharged the day of surgery will not be allowed to drive home and must have someone with them for 24 hours.    Special instructions:   DO NOT smoke tobacco or vape for 24 hours before your procedure.  Please read over the following fact sheets that you were given. Anesthesia Post-op Instructions and Care and Recovery After Surgery      Upper Endoscopy, Adult, Care After After the procedure, it is common to have a sore throat. It is also common to have: Mild stomach pain or discomfort. Bloating. Nausea. Follow these instructions at home: The instructions below may help you care for  yourself at home. Your health care provider may give you more instructions. If you have questions, ask your health care provider. If you were given a sedative during the procedure, it can affect you for several hours. Do not drive or operate machinery until your health care provider says that it is safe. If you will be going home right after the procedure, plan to have a responsible adult: Take you home from the hospital or clinic. You will not be allowed to drive. Care for you for the time you are told. Follow instructions from your health care provider about what you may eat and drink. Return to your normal activities as told by your health care provider. Ask your health care provider what activities are safe for you. Take over-the-counter and prescription medicines only as told by your health care provider. Contact a health care provider if you: Have a sore throat that lasts longer than one day. Have trouble swallowing. Have a fever. Get help right away if you: Vomit blood or your vomit looks like coffee grounds. Have bloody, black, or tarry stools. Have a very bad sore throat or you cannot swallow. Have difficulty breathing or very bad pain in your chest or abdomen. These symptoms may be an emergency. Get help right away. Call 911. Do not wait to  see if the symptoms will go away. Do not drive yourself to the hospital. Summary After the procedure, it is common to have a sore throat, mild stomach discomfort, bloating, and nausea. If you were given a sedative during the procedure, it can affect you for several hours. Do not drive until your health care provider says that it is safe. Follow instructions from your health care provider about what you may eat and drink. Return to your normal activities as told by your health care provider. This information is not intended to replace advice given to you by your health care provider. Make sure you discuss any questions you have with your health  care provider. Document Revised: 01/27/2022 Document Reviewed: 01/27/2022 Elsevier Patient Education  2023 [employer] Esophageal Dilatation Esophageal dilatation, also called esophageal dilation, is a procedure to widen or open a blocked or narrowed part of the esophagus. The esophagus is the part of the body that moves food and liquid from the mouth to the stomach. You may need this procedure if: You have a buildup of scar tissue in your esophagus that makes it difficult, painful, or impossible to swallow. This can be caused by gastroesophageal reflux disease (GERD). You have cancer of the esophagus. There is a problem with how food moves through your esophagus. In some cases, you may need this procedure repeated at a later time to dilate the esophagus gradually. Tell a health care provider about: Any allergies you have. All medicines you are taking, including vitamins, herbs, eye drops, creams, and over-the-counter medicines. Any problems you or family members have had with anesthetic medicines. Any blood disorders you have. Any surgeries you have had. Any medical conditions you have. Any antibiotic medicines you are required to take before dental procedures. Whether you are pregnant or may be pregnant. What are the risks? Generally, this is a safe procedure. However, problems may occur, including: Bleeding due to a tear in the lining of the esophagus. A hole, or perforation, in the esophagus. What happens before the procedure? Ask your health care provider about: Changing or stopping your regular medicines. This is especially important if you are taking diabetes medicines or blood thinners. Taking medicines such as aspirin and ibuprofen. These medicines can thin your blood. Do not take these medicines unless your health care provider tells you to take them. Taking over-the-counter medicines, vitamins, herbs, and supplements. Follow instructions from your health care provider about  eating or drinking restrictions. Plan to have a responsible adult take you home from the hospital or clinic. Plan to have a responsible adult care for you for the time you are told after you leave the hospital or clinic. This is important. What happens during the procedure? You may be given a medicine to help you relax (sedative). A numbing medicine may be sprayed into the back of your throat, or you may gargle the medicine. Your health care provider may perform the dilatation using various surgical instruments, such as: Simple dilators. This instrument is carefully placed in the esophagus to stretch it. Guided wire bougies. This involves using an endoscope to insert a wire into the esophagus. A dilator is passed over this wire to enlarge the esophagus. Then the wire is removed. Balloon dilators. An endoscope with a small balloon is inserted into the esophagus. The balloon is inflated to stretch the esophagus and open it up. The procedure may vary among health care providers and hospitals. What can I expect after the procedure? Your blood pressure, heart rate, breathing  rate, and blood oxygen level will be monitored until you leave the hospital or clinic. Your throat may feel slightly sore and numb. This will get better over time. You will not be allowed to eat or drink until your throat is no longer numb. When you are able to drink, urinate, and sit on the edge of the bed without nausea or dizziness, you may be able to return home. Follow these instructions at home: Take over-the-counter and prescription medicines only as told by your health care provider. If you were given a sedative during the procedure, it can affect you for several hours. Do not drive or operate machinery until your health care provider says that it is safe. Plan to have a responsible adult care for you for the time you are told. This is important. Follow instructions from your health care provider about any eating or  drinking restrictions. Do not use any products that contain nicotine or tobacco, such as cigarettes, e-cigarettes, and chewing tobacco. If you need help quitting, ask your health care provider. Keep all follow-up visits. This is important. Contact a health care provider if: You have a fever. You have pain that is not relieved by medicine. Get help right away if: You have chest pain. You have trouble breathing. You have trouble swallowing. You vomit blood. You have black, tarry, or bloody stools. These symptoms may represent a serious problem that is an emergency. Do not wait to see if the symptoms will go away. Get medical help right away. Call your local emergency services (911 in the [place] Do not drive yourself to the hospital. Summary Esophageal dilatation, also called esophageal dilation, is a procedure to widen or open a blocked or narrowed part of the esophagus. Plan to have a responsible adult take you home from the hospital or clinic. For this procedure, a numbing medicine may be sprayed into the back of your throat, or you may gargle the medicine. Do not drive or operate machinery until your health care provider says that it is safe. This information is not intended to replace advice given to you by your health care provider. Make sure you discuss any questions you have with your health care provider. Document Revised: 03/05/2020 Document Reviewed: 03/05/2020 Elsevier Patient Education  2023 Else[employer] Inc. Monitored Anesthesia Care, Care After The following information offers guidance on how to care for yourself after your procedure. Your health care provider may also give you more specific instructions. If you have problems or questions, contact your health care provider. What can I expect after the procedure? After the procedure, it is common to have: Tiredness. Little or no memory about what happened during or after the procedure. Impaired judgment when it comes to making  decisions. Nausea or vomiting. Some trouble with balance. Follow these instructions at home: For the time period you were told by your health care provider:  Rest. Do not participate in activities where you could fall or become injured. Do not drive or use machinery. Do not drink alcohol. Do not take sleeping pills or medicines that cause drowsiness. Do not make important decisions or sign legal documents. Do not take care of children on your own. Medicines Take over-the-counter and prescription medicines only as told by your health care provider. If you were prescribed antibiotics, take them as told by your health care provider. Do not stop using the antibiotic even if you start to feel better. Eating and drinking Follow instructions from your health care provider about what you may  eat and drink. Drink enough fluid to keep your urine pale yellow. If you vomit: Drink clear fluids slowly and in small amounts as you are able. Clear fluids include water, ice chips, low-calorie sports drinks, and fruit juice that has water added to it (diluted fruit juice). Eat light and bland foods in small amounts as you are able. These foods include bananas, applesauce, rice, lean meats, toast, and crackers. General instructions  Have a responsible adult stay with you for the time you are told. It is important to have someone help care for you until you are awake and alert. If you have sleep apnea, surgery and some medicines can increase your risk for breathing problems. Follow instructions from your health care provider about wearing your sleep device: When you are sleeping. This includes during daytime naps. While taking prescription pain medicines, sleeping medicines, or medicines that make you drowsy. Do not use any products that contain nicotine or tobacco. These products include cigarettes, chewing tobacco, and vaping devices, such as e-cigarettes. If you need help quitting, ask your health care  provider. Contact a health care provider if: You feel nauseous or vomit every time you eat or drink. You feel light-headed. You are still sleepy or having trouble with balance after 24 hours. You get a rash. You have a fever. You have redness or swelling around the IV site. Get help right away if: You have trouble breathing. You have new confusion after you get home. These symptoms may be an emergency. Get help right away. Call 911. Do not wait to see if the symptoms will go away. Do not drive yourself to the hospital. This information is not intended to replace advice given to you by your health care provider. Make sure you discuss any questions you have with your health care provider. Document Revised: 03/15/2022 Document Reviewed: 03/15/2022 Elsevier Patient Education  2023 [employer].

## 2023-03-16 ENCOUNTER — Encounter (HOSPITAL_COMMUNITY): Payer: Self-pay

## 2023-03-16 ENCOUNTER — Encounter (HOSPITAL_COMMUNITY)
Admission: RE | Admit: 2023-03-16 | Discharge: 2023-03-16 | Disposition: A | Discharge status: Home / Self Care | Payer: Medicare HMO | Source: Ambulatory Visit | Attending: Internal Medicine | Admitting: Internal Medicine

## 2023-03-16 VITALS — BP 124/80 | HR 80 | Temp 97.7°F | Resp 18 | Ht 73.0 in | Wt 249.8 lb

## 2023-03-16 DIAGNOSIS — Z01818 Encounter for other preprocedural examination: Secondary | ICD-10-CM | POA: Insufficient documentation

## 2023-03-16 DIAGNOSIS — E119 Type 2 diabetes mellitus without complications: Secondary | ICD-10-CM | POA: Diagnosis not present

## 2023-03-16 HISTORY — DX: Cardiac murmur, unspecified: R01.1

## 2023-03-16 HISTORY — DX: Gastric ulcer, unspecified as acute or chronic, without hemorrhage or perforation: K25.9

## 2023-03-16 LAB — BASIC METABOLIC PANEL
Anion gap: 9 (ref 5–15)
BUN: 10 mg/dL (ref 8–23)
CO2: 23 mmol/L (ref 22–32)
Calcium: 9.5 mg/dL (ref 8.9–10.3)
Chloride: 104 mmol/L (ref 98–111)
Creatinine, Ser: 0.88 mg/dL (ref 0.61–1.24)
GFR, Estimated: >60 mL/min (ref >60)
Glucose, Bld: 105 mg/dL — ABNORMAL HIGH (ref 70–99)
Potassium: 3.8 mmol/L (ref 3.5–5.1)
Sodium: 136 mmol/L (ref 135–145)

## 2023-03-21 ENCOUNTER — Ambulatory Visit (HOSPITAL_BASED_OUTPATIENT_CLINIC_OR_DEPARTMENT_OTHER): Payer: Medicare HMO | Admitting: Anesthesiology

## 2023-03-21 ENCOUNTER — Encounter (HOSPITAL_COMMUNITY)
Admission: RE | Disposition: A | Discharge status: Home / Self Care | Payer: Self-pay | Source: Home / Self Care | Attending: Internal Medicine

## 2023-03-21 ENCOUNTER — Encounter (HOSPITAL_COMMUNITY): Payer: Self-pay

## 2023-03-21 ENCOUNTER — Ambulatory Visit (HOSPITAL_COMMUNITY): Payer: Medicare HMO | Admitting: Anesthesiology

## 2023-03-21 ENCOUNTER — Ambulatory Visit (HOSPITAL_COMMUNITY)
Admission: RE | Admit: 2023-03-21 | Discharge: 2023-03-21 | Disposition: A | Discharge status: Home / Self Care | Payer: Medicare HMO | Attending: Internal Medicine | Admitting: Internal Medicine

## 2023-03-21 ENCOUNTER — Other Ambulatory Visit: Payer: Self-pay

## 2023-03-21 DIAGNOSIS — K2950 Unspecified chronic gastritis without bleeding: Secondary | ICD-10-CM | POA: Diagnosis not present

## 2023-03-21 DIAGNOSIS — F17210 Nicotine dependence, cigarettes, uncomplicated: Secondary | ICD-10-CM | POA: Diagnosis not present

## 2023-03-21 DIAGNOSIS — M1990 Unspecified osteoarthritis, unspecified site: Secondary | ICD-10-CM | POA: Diagnosis not present

## 2023-03-21 DIAGNOSIS — E119 Type 2 diabetes mellitus without complications: Secondary | ICD-10-CM | POA: Diagnosis not present

## 2023-03-21 DIAGNOSIS — Z8711 Personal history of peptic ulcer disease: Secondary | ICD-10-CM | POA: Insufficient documentation

## 2023-03-21 DIAGNOSIS — Z1211 Encounter for screening for malignant neoplasm of colon: Secondary | ICD-10-CM

## 2023-03-21 DIAGNOSIS — K219 Gastro-esophageal reflux disease without esophagitis: Secondary | ICD-10-CM | POA: Insufficient documentation

## 2023-03-21 DIAGNOSIS — K2970 Gastritis, unspecified, without bleeding: Secondary | ICD-10-CM

## 2023-03-21 DIAGNOSIS — I10 Essential (primary) hypertension: Secondary | ICD-10-CM

## 2023-03-21 DIAGNOSIS — K449 Diaphragmatic hernia without obstruction or gangrene: Secondary | ICD-10-CM | POA: Insufficient documentation

## 2023-03-21 DIAGNOSIS — Z7984 Long term (current) use of oral hypoglycemic drugs: Secondary | ICD-10-CM | POA: Insufficient documentation

## 2023-03-21 DIAGNOSIS — E785 Hyperlipidemia, unspecified: Secondary | ICD-10-CM | POA: Insufficient documentation

## 2023-03-21 DIAGNOSIS — J449 Chronic obstructive pulmonary disease, unspecified: Secondary | ICD-10-CM | POA: Insufficient documentation

## 2023-03-21 DIAGNOSIS — F17200 Nicotine dependence, unspecified, uncomplicated: Secondary | ICD-10-CM | POA: Diagnosis not present

## 2023-03-21 DIAGNOSIS — Z79899 Other long term (current) drug therapy: Secondary | ICD-10-CM | POA: Diagnosis not present

## 2023-03-21 DIAGNOSIS — R1310 Dysphagia, unspecified: Secondary | ICD-10-CM | POA: Insufficient documentation

## 2023-03-21 DIAGNOSIS — I350 Nonrheumatic aortic (valve) stenosis: Secondary | ICD-10-CM | POA: Insufficient documentation

## 2023-03-21 HISTORY — PX: ESOPHAGOGASTRODUODENOSCOPY (EGD) WITH PROPOFOL: SHX5813

## 2023-03-21 HISTORY — PX: BIOPSY: SHX5522

## 2023-03-21 LAB — GLUCOSE, CAPILLARY: Glucose-Capillary: 141 mg/dL — ABNORMAL HIGH (ref 70–99)

## 2023-03-21 SURGERY — ESOPHAGOGASTRODUODENOSCOPY (EGD) WITH PROPOFOL
Anesthesia: General

## 2023-03-21 MED ORDER — LACTATED RINGERS IV SOLN: INTRAVENOUS | Status: DC | PRN

## 2023-03-21 MED ORDER — LIDOCAINE HCL (CARDIAC) PF 100 MG/5ML IV SOSY
PREFILLED_SYRINGE | INTRAVENOUS | Status: DC | PRN
  Administered 2023-03-21: 60 mg via INTRATRACHEAL

## 2023-03-21 MED ORDER — PROPOFOL 10 MG/ML IV BOLUS
INTRAVENOUS | Status: DC | PRN
  Administered 2023-03-21 (×2): 100 mg via INTRAVENOUS

## 2023-03-21 MED ORDER — PHENYLEPHRINE HCL (PRESSORS) 10 MG/ML IV SOLN
INTRAVENOUS | Status: DC | PRN
  Administered 2023-03-21 (×7): 200 ug via INTRAVENOUS

## 2023-03-21 MED ORDER — LACTATED RINGERS IV SOLN: INTRAVENOUS | Status: DC

## 2023-03-21 NOTE — Op Note (Signed)
[facility] Patient Name: Gerald Mccann Procedure Date: 03/21/2023 12:50 PM MRN: [id number] Date of Birth: 1960-12-18 Attending MD: Hennie Duos. Marletta Lor , [place], [id number] CSN: [id number] Age: 62 Admit Type: Outpatient Procedure:                Upper GI endoscopy Indications:              Dysphagia Providers:                Hennie Duos. Marletta Lor, DO, Buel Ream. Thomasena Edis RN, RN,                            Lennice Sites Technician, Technician Referring MD:              Medicines:                See the Anesthesia note for documentation of the                            administered medications Complications:            No immediate complications. Estimated Blood Loss:     Estimated blood loss was minimal. Procedure:                Pre-Anesthesia Assessment:                           - The anesthesia plan was to use monitored                            anesthesia care (MAC).                           After obtaining informed consent, the endoscope was                            passed under direct vision. Throughout the                            procedure, the patient's blood pressure, pulse, and                            oxygen saturations were monitored continuously. The                            [id number]-H190 ([id number]) scope was introduced through the                            mouth, and advanced to the second part of duodenum.                            The upper GI endoscopy was accomplished without                            difficulty. The patient tolerated the procedure                            well. Scope In:  12:59:59 PM Scope Out: 1:03:04 PM Total Procedure Duration: 0 hours 3 minutes 5 seconds  Findings:      There is no endoscopic evidence of stenosis or stricture in the entire       esophagus.      The Z-line was regular.      Patchy mild inflammation characterized by erythema was found in the       gastric body and in the gastric antrum. Biopsies were taken with a cold        forceps for Helicobacter pylori testing.      The duodenal bulb, first portion of the duodenum and second portion of       the duodenum were normal. Impression:               - Z-line regular.                           - Gastritis. Biopsied.                           - Normal duodenal bulb, first portion of the                            duodenum and second portion of the duodenum. Moderate Sedation:      Per Anesthesia Care Recommendation:           - Patient has a contact number available for                            emergencies. The signs and symptoms of potential                            delayed complications were discussed with the                            patient. Return to normal activities tomorrow.                            Written discharge instructions were provided to the                            patient.                           - Resume previous diet.                           - Continue present medications.                           - Await pathology results.                           - Return to GI clinic in 3 months.                           - Use Prilosec (omeprazole) 40 mg PO daily.                           -  Consider MBSS Procedure Code(s):        --- Professional ---                           [id number], Esophagogastroduodenoscopy, flexible,                            transoral; with biopsy, single or multiple Diagnosis Code(s):        --- Professional ---                           K29.70, Gastritis, unspecified, without bleeding                           R13.10, Dysphagia, unspecified CPT copyright 2022 American Medical Association. All rights reserved. The codes documented in this report are preliminary and upon coder review may  be revised to meet current compliance requirements. Hennie Duos. Marletta Lor, DO Hennie Duos. Marletta Lor, DO 03/21/2023 1:06:35 PM This report has been signed electronically. Number of Addenda: 0

## 2023-03-21 NOTE — Anesthesia Postprocedure Evaluation (Signed)
Anesthesia Post Note  Patient: Gerald Mccann  Procedure(s) Performed: ESOPHAGOGASTRODUODENOSCOPY (EGD) WITH PROPOFOL BIOPSY  Patient location during evaluation: Phase II Anesthesia Type: General Level of consciousness: awake and alert and oriented Pain management: pain level controlled Vital Signs Assessment: post-procedure vital signs reviewed and stable Respiratory status: spontaneous breathing, nonlabored ventilation and respiratory function stable Cardiovascular status: blood pressure returned to baseline and stable Postop Assessment: no apparent nausea or vomiting Anesthetic complications: no  No notable events documented.   Last Vitals:  Vitals:   03/21/23 1313 03/21/23 1317  BP: (!) 106/59 123/88  Pulse: 64 64  Resp: 16   Temp: 36.5 C   SpO2: 94% 97%    Last Pain:  Vitals:   03/21/23 1316  TempSrc:   PainSc: 0-No pain                 Theoren Palka C Deaire Mcwhirter

## 2023-03-21 NOTE — Interval H&P Note (Signed)
History and Physical Interval Note:  03/21/2023 12:48 PM  Gerald Mccann  has presented today for surgery, with the diagnosis of dysphagia, colon cancer screening.  The various methods of treatment have been discussed with the patient and family. After consideration of risks, benefits and other options for treatment, the patient has consented to  Procedure(s): ESOPHAGOGASTRODUODENOSCOPY (EGD) WITH PROPOFOL (N/A) BALLOON DILATION (N/A) as a surgical intervention.  The patient's history has been reviewed, patient examined, no change in status, stable for surgery.  I have reviewed the patient's chart and labs.  Questions were answered to the patient's satisfaction.     Lanelle Bal

## 2023-03-21 NOTE — Transfer of Care (Signed)
Immediate Anesthesia Transfer of Care Note  Patient: Gerald Mccann  Procedure(s) Performed: ESOPHAGOGASTRODUODENOSCOPY (EGD) WITH PROPOFOL BIOPSY  Patient Location: Endoscopy Unit  Anesthesia Type:General  Level of Consciousness: awake, alert , and oriented  Airway & Oxygen Therapy: Patient Spontanous Breathing  Post-op Assessment: Report given to RN and Post -op Vital signs reviewed and stable  Post vital signs: Reviewed and stable  Last Vitals:  Vitals Value Taken Time  BP 123/88 03/21/23 1317  Temp 36.5 C 03/21/23 1313  Pulse 64 03/21/23 1317  Resp 16 03/21/23 1313  SpO2 97 % 03/21/23 1317    Last Pain:  Vitals:   03/21/23 1316  TempSrc:   PainSc: 0-No pain         Complications: No notable events documented.

## 2023-03-21 NOTE — Discharge Instructions (Addendum)
EGD Discharge instructions Please read the instructions outlined below and refer to this sheet in the next few weeks. These discharge instructions provide you with general information on caring for yourself after you leave the hospital. Your doctor may also give you specific instructions. While your treatment has been planned according to the most current medical practices available, unavoidable complications occasionally occur. If you have any problems or questions after discharge, please call your doctor. ACTIVITY You may resume your regular activity but move at a slower pace for the next 24 hours.  Take frequent rest periods for the next 24 hours.  Walking will help expel (get rid of) the air and reduce the bloated feeling in your abdomen.  No driving for 24 hours (because of the anesthesia (medicine) used during the test).  You may shower.  Do not sign any important legal documents or operate any machinery for 24 hours (because of the anesthesia used during the test).  NUTRITION Drink plenty of fluids.  You may resume your normal diet.  Begin with a light meal and progress to your normal diet.  Avoid alcoholic beverages for 24 hours or as instructed by your caregiver.  MEDICATIONS You may resume your normal medications unless your caregiver tells you otherwise.  WHAT YOU CAN EXPECT TODAY You may experience abdominal discomfort such as a feeling of fullness or "gas" pains.  FOLLOW-UP Your doctor will discuss the results of your test with you.  SEEK IMMEDIATE MEDICAL ATTENTION IF ANY OF THE FOLLOWING OCCUR: Excessive nausea (feeling sick to your stomach) and/or vomiting.  Severe abdominal pain and distention (swelling).  Trouble swallowing.  Temperature over 101 F (37.8 C).  Rectal bleeding or vomiting of blood.   Your EGD revealed mild amount inflammation in your stomach.  I took biopsies of this to rule out infection with a bacteria called H. pylori.  Await pathology results, my  office will contact you.  Your esophagus was wide open.  I did not need to dilate it today.  Small bowel appeared normal.  Continue on omeprazole daily.  Follow-up in GI office in 2 to 3 months.   I hope you have a great rest of your week!  Hennie Duos. Marletta Lor, D.O. Gastroenterology and Hepatology [facility]

## 2023-03-21 NOTE — Anesthesia Preprocedure Evaluation (Addendum)
Anesthesia Evaluation  Patient identified by MRN, date of birth, ID band Patient awake    Reviewed: Allergy & Precautions, H&P , NPO status , Patient's Chart, lab work & pertinent test results, reviewed documented beta blocker date and time   Airway Mallampati: II  TM Distance: >3 FB Neck ROM: Full    Dental  (+) Dental Advisory Given, Missing, Chipped,    Pulmonary shortness of breath and with exertion, COPD,  COPD inhaler, Current Smoker   Pulmonary exam normal breath sounds clear to auscultation       Cardiovascular METS: 3 - Mets hypertension, Pt. on medications and Pt. on home beta blockers + Valvular Problems/Murmurs AS  Rhythm:Regular Rate:Normal + Systolic murmurs Antoine Poche, MD 03/31/2022 10:34 AM EDT   Echo shows aortic valve remains moderately stiff, has not significantly progressed. Just something for [place] to continue to monitor with repeat US 1 year   Dominga Ferry MD   1. Left ventricular ejection fraction, by estimation, is 65 to 70%. The  left ventricle has normal function. Left ventricular endocardial border  not optimally defined to evaluate regional wall motion. There is mild left  ventricular hypertrophy. Left  ventricular diastolic parameters are consistent with Grade I diastolic  dysfunction (impaired relaxation).   2. Right ventricular systolic function is normal. The right ventricular  size is normal. There is normal pulmonary artery systolic pressure. The  estimated right ventricular systolic pressure is 18.7 mmHg.   3. The mitral valve is grossly normal. Trivial mitral valve  regurgitation.   4. The aortic valve is probably functionally bicuspid. There is moderate  calcification of the aortic valve. Aortic valve regurgitation is not  visualized. Moderate to severe aortic valve stenosis. Aortic valve area,  by VTI measures 1.11 cm. Aortic valve   mean gradient measures 24.6 mmHg. Dimentionless index  0.29.   5. The inferior vena cava is normal in size with greater than 50%  respiratory variability, suggesting right atrial pressure of 3 mmHg.      Neuro/Psych  Neuromuscular disease  negative psych ROS   GI/Hepatic Neg liver ROS, hiatal hernia, PUD,GERD  Medicated and Poorly Controlled,,  Endo/Other  diabetes, Well Controlled, Type 2, Oral Hypoglycemic Agents    Renal/GU negative Renal ROS  negative genitourinary   Musculoskeletal  (+) Arthritis , Osteoarthritis,    Abdominal   Peds negative pediatric ROS (+)  Hematology negative hematology ROS (+)   Anesthesia Other Findings   Reproductive/Obstetrics negative OB ROS                             Anesthesia Physical Anesthesia Plan  ASA: 3  Anesthesia Plan: General   Post-op Pain Management: Minimal or no pain anticipated   Induction: Intravenous  PONV Risk Score and Plan: Propofol infusion  Airway Management Planned: Nasal Cannula and Natural Airway  Additional Equipment:   Intra-op Plan:   Post-operative Plan:   Informed Consent: I have reviewed the patients History and Physical, chart, labs and discussed the procedure including the risks, benefits and alternatives for the proposed anesthesia with the patient or authorized representative who has indicated his/her understanding and acceptance.     Dental advisory given  Plan Discussed with: CRNA and Surgeon  Anesthesia Plan Comments:         Anesthesia Quick Evaluation

## 2023-03-22 LAB — SURGICAL PATHOLOGY

## 2023-03-24 ENCOUNTER — Encounter (HOSPITAL_COMMUNITY): Payer: Self-pay | Admitting: Internal Medicine

## 2023-04-04 DIAGNOSIS — G894 Chronic pain syndrome: Secondary | ICD-10-CM | POA: Diagnosis not present

## 2023-04-04 DIAGNOSIS — E119 Type 2 diabetes mellitus without complications: Secondary | ICD-10-CM | POA: Diagnosis not present

## 2023-04-04 DIAGNOSIS — M47816 Spondylosis without myelopathy or radiculopathy, lumbar region: Secondary | ICD-10-CM | POA: Diagnosis not present

## 2023-04-04 DIAGNOSIS — I714 Abdominal aortic aneurysm, without rupture, unspecified: Secondary | ICD-10-CM | POA: Diagnosis not present

## 2023-04-04 DIAGNOSIS — K219 Gastro-esophageal reflux disease without esophagitis: Secondary | ICD-10-CM | POA: Diagnosis not present

## 2023-04-04 DIAGNOSIS — E7849 Other hyperlipidemia: Secondary | ICD-10-CM | POA: Diagnosis not present

## 2023-04-04 DIAGNOSIS — M47814 Spondylosis without myelopathy or radiculopathy, thoracic region: Secondary | ICD-10-CM | POA: Diagnosis not present

## 2023-04-04 DIAGNOSIS — I10 Essential (primary) hypertension: Secondary | ICD-10-CM | POA: Diagnosis not present

## 2023-04-04 DIAGNOSIS — F419 Anxiety disorder, unspecified: Secondary | ICD-10-CM | POA: Diagnosis not present

## 2023-04-13 ENCOUNTER — Ambulatory Visit (HOSPITAL_COMMUNITY): Admission: RE | Admit: 2023-04-13 | Payer: Medicare HMO | Source: Ambulatory Visit

## 2023-04-14 ENCOUNTER — Encounter: Payer: Self-pay | Admitting: Gastroenterology

## 2023-04-25 DIAGNOSIS — R030 Elevated blood-pressure reading, without diagnosis of hypertension: Secondary | ICD-10-CM | POA: Diagnosis not present

## 2023-04-25 DIAGNOSIS — F17210 Nicotine dependence, cigarettes, uncomplicated: Secondary | ICD-10-CM | POA: Diagnosis not present

## 2023-04-25 DIAGNOSIS — K921 Melena: Secondary | ICD-10-CM | POA: Diagnosis not present

## 2023-04-25 DIAGNOSIS — Z6833 Body mass index (BMI) 33.0-33.9, adult: Secondary | ICD-10-CM | POA: Diagnosis not present

## 2023-05-25 ENCOUNTER — Telehealth: Payer: Self-pay | Admitting: Cardiology

## 2023-05-25 NOTE — Telephone Encounter (Signed)
Patient called to re-schedule his echo that he missed at [facility]

## 2023-05-26 ENCOUNTER — Encounter: Payer: Self-pay | Admitting: Cardiology

## 2023-05-30 ENCOUNTER — Encounter: Payer: Self-pay | Admitting: Cardiology

## 2023-06-06 ENCOUNTER — Telehealth: Payer: Self-pay | Admitting: Cardiology

## 2023-06-06 ENCOUNTER — Ambulatory Visit (HOSPITAL_COMMUNITY): Admission: RE | Admit: 2023-06-06 | Payer: Medicare HMO | Source: Ambulatory Visit

## 2023-06-06 DIAGNOSIS — I350 Nonrheumatic aortic (valve) stenosis: Secondary | ICD-10-CM

## 2023-06-06 NOTE — Telephone Encounter (Signed)
Office states that pt's order for Echo has expired. They are needing for it to be updated due to pt coming in this afternoon for appt.. Please advise

## 2023-06-06 NOTE — Telephone Encounter (Signed)
Order placed

## 2023-06-09 ENCOUNTER — Other Ambulatory Visit: Payer: Medicare HMO

## 2023-06-30 DIAGNOSIS — I714 Abdominal aortic aneurysm, without rupture, unspecified: Secondary | ICD-10-CM | POA: Diagnosis not present

## 2023-06-30 DIAGNOSIS — M47816 Spondylosis without myelopathy or radiculopathy, lumbar region: Secondary | ICD-10-CM | POA: Diagnosis not present

## 2023-06-30 DIAGNOSIS — M47814 Spondylosis without myelopathy or radiculopathy, thoracic region: Secondary | ICD-10-CM | POA: Diagnosis not present

## 2023-06-30 DIAGNOSIS — F419 Anxiety disorder, unspecified: Secondary | ICD-10-CM | POA: Diagnosis not present

## 2023-06-30 DIAGNOSIS — I10 Essential (primary) hypertension: Secondary | ICD-10-CM | POA: Diagnosis not present

## 2023-06-30 DIAGNOSIS — K219 Gastro-esophageal reflux disease without esophagitis: Secondary | ICD-10-CM | POA: Diagnosis not present

## 2023-06-30 DIAGNOSIS — G894 Chronic pain syndrome: Secondary | ICD-10-CM | POA: Diagnosis not present

## 2023-06-30 DIAGNOSIS — E7849 Other hyperlipidemia: Secondary | ICD-10-CM | POA: Diagnosis not present

## 2023-06-30 DIAGNOSIS — E119 Type 2 diabetes mellitus without complications: Secondary | ICD-10-CM | POA: Diagnosis not present

## 2023-07-05 ENCOUNTER — Other Ambulatory Visit: Payer: Medicare HMO

## 2023-07-19 ENCOUNTER — Other Ambulatory Visit: Payer: Medicare HMO

## 2023-08-02 ENCOUNTER — Ambulatory Visit: Payer: Medicare HMO | Attending: Cardiology

## 2023-08-02 DIAGNOSIS — I350 Nonrheumatic aortic (valve) stenosis: Secondary | ICD-10-CM | POA: Diagnosis not present

## 2023-08-03 LAB — ECHOCARDIOGRAM COMPLETE
AV Mean grad: 37 mm[Hg]
AV Peak grad: 58.4 mm[Hg]
Ao pk vel: 3.82 m/s
Area-P 1/2: 1.94 cm2
Calc EF: 65.4 %
S' Lateral: 3.6 cm
Single Plane A2C EF: 61.5 %
Single Plane A4C EF: 69.5 %

## 2023-09-20 ENCOUNTER — Encounter: Payer: Self-pay | Admitting: Cardiology

## 2023-09-20 ENCOUNTER — Ambulatory Visit: Payer: Medicare HMO | Admitting: Cardiology

## 2023-09-20 NOTE — Progress Notes (Deleted)
Clinical Summary Gerald Mccann is a 61 y.o.male  seen today for follow up of the following medical problems.    1.Aortic stenosis - Echocardiogram on 03/10/2017 demonstrated normal LV systolic function, LVEF 60 to 16%, moderate LVH, mild to moderate aortic stenosis, and mild aortic root and proximal ascending aortic dilatation, both of which measured 37 mm.   -12/2020 echo: LVEF 65-70%, grade I dd, normal RV, Mod AS mean grade 22 AVA VTI 1.32     03/2022 echo: LVEF 65-70%, no WMAs, grade I dd, mod AS mean grad 25 AVA VTI 1.11  08/2023 echo: LVEF 60-65%, grade I dd, mod AS mean grad 37,  DI 0.32, AVA VTI not reported       2. HTN  - home bp's 120s/80s - he is compliant with meds       3.PAD - 03/2021 ABI normal right, mild left 0.82 - 03/2021 arterial US right occluded ATA, normal left - seen by vascular 04/2021, recs to monitor at this time.F/u just as needed   - ongoing leg pains at times with walking, no limiting - not interested in SET program     6. Hyperlipidemia - reports recent labs with his pain doctor Dr Thompson Grayer in [place] - he is on pravastatin 12/2021 TC 167 TG 252 HDL 44 LDL 73 08/2022 TC 128 HDL 44 TG 128 LDL 58       At 64 will need AAA [place]     Enjoys riding his motorcycle, looking to sell his bike. His son had recent accident. Raising 2 grandchilder ages 53 and 22. Has one great grandbaby.  Past Medical History:  Diagnosis Date   Aortic stenosis    Arthrosis of left acromioclavicular joint    Chronic back pain    Complete rotator cuff tear of left shoulder    COPD (chronic obstructive pulmonary disease) (HCC)    Current every day smoker    smokes a pack and a half a day   Diabetes (HCC)    Gastric ulcer    Heart murmur    Hiatal hernia    HLD (hyperlipidemia)    HTN (hypertension)    Impingement syndrome of left shoulder    Pain management contract discussed    post op pain meds to be managed by [facility] who currently  manages his pain meds     Allergies  Allergen Reactions   Trelegy Ellipta [Fluticasone-Umeclidin-Vilant]     Bottom lip swelled. Required topical steroids.     Current Outpatient Medications  Medication Sig Dispense Refill   amLODipine (NORVASC) 10 MG tablet TAKE ONE TABLET BY MOUTH DAILY. 30 tablet 0   aspirin 81 MG tablet Take 81 mg by mouth daily.     atorvastatin (LIPITOR) 20 MG tablet Take 20 mg by mouth at bedtime.     Blood Pressure Monitor MISC Use as directed 1 each 0   busPIRone (BUSPAR) 5 MG tablet Take 5 mg by mouth 2 (two) times daily.  2   carvedilol (COREG) 3.125 MG tablet Take 3.125 mg by mouth 2 (two) times daily with a meal.     citalopram (CELEXA) 40 MG tablet Take 60 mg by mouth daily.  0   cyclobenzaprine (FLEXERIL) 10 MG tablet Take 20 mg by mouth at bedtime.     diclofenac sodium (VOLTAREN) 1 % GEL Apply 1 application  topically 4 (four) times daily as needed (pain).     diphenhydrAMINE (BENADRYL) 25 mg capsule Take  100 mg by mouth at bedtime.     fenofibrate (TRICOR) 48 MG tablet Take 48 mg by mouth daily.     gabapentin (NEURONTIN) 100 MG capsule Take 300 mg by mouth at bedtime.     metFORMIN (GLUCOPHAGE-XR) 500 MG 24 hr tablet Take 500 mg by mouth daily with breakfast.     olmesartan (BENICAR) 40 MG tablet Take 40 mg by mouth daily.     omeprazole (PRILOSEC) 40 MG capsule Take 40 mg by mouth daily.     oxyCODONE-acetaminophen (PERCOCET) 10-325 MG tablet Take 1 tablet by mouth every 6 (six) hours.  0   RYBELSUS 3 MG TABS Take 3 mg by mouth daily.     VENTOLIN HFA 108 (90 Base) MCG/ACT inhaler Inhale 2 puffs into the lungs every 4 (four) hours as needed for wheezing or shortness of breath.     No current facility-administered medications for this visit.     Past Surgical History:  Procedure Laterality Date   BIOPSY  03/21/2023   Procedure: BIOPSY;  Surgeon: Lanelle Bal, DO;  Location: [facility];  Service: Endoscopy;;    ESOPHAGOGASTRODUODENOSCOPY (EGD) WITH PROPOFOL N/A 03/21/2023   Procedure: ESOPHAGOGASTRODUODENOSCOPY (EGD) WITH PROPOFOL;  Surgeon: Lanelle Bal, DO;  Location: [facility];  Service: Endoscopy;  Laterality: N/A;   right knee surgery       Allergies  Allergen Reactions   Trelegy Ellipta [Fluticasone-Umeclidin-Vilant]     Bottom lip swelled. Required topical steroids.      Family History  Problem Relation Age of Onset   Coronary artery disease Mother    Coronary artery disease Father    Heart attack Father    Heart attack Brother 48       deceased   Asthma Other      Social History Gerald Mccann reports that he has been smoking cigarettes. He started smoking about 51 years ago. He has a 76.6 pack-year smoking history. He has never been exposed to tobacco smoke. He has never used smokeless tobacco. Gerald Mccann reports current alcohol use.   Review of Systems CONSTITUTIONAL: No weight loss, fever, chills, weakness or fatigue.  HEENT: Eyes: No visual loss, blurred vision, double vision or yellow sclerae.No hearing loss, sneezing, congestion, runny nose or sore throat.  SKIN: No rash or itching.  CARDIOVASCULAR:  RESPIRATORY: No shortness of breath, cough or sputum.  GASTROINTESTINAL: No anorexia, nausea, vomiting or diarrhea. No abdominal pain or blood.  GENITOURINARY: No burning on urination, no polyuria NEUROLOGICAL: No headache, dizziness, syncope, paralysis, ataxia, numbness or tingling in the extremities. No change in bowel or bladder control.  MUSCULOSKELETAL: No muscle, back pain, joint pain or stiffness.  LYMPHATICS: No enlarged nodes. No history of splenectomy.  PSYCHIATRIC: No history of depression or anxiety.  ENDOCRINOLOGIC: No reports of sweating, cold or heat intolerance. No polyuria or polydipsia.  Marland Kitchen   Physical Examination There were no vitals filed for this visit. There were no vitals filed for this visit.  Gen: resting comfortably, no acute  distress HEENT: no scleral icterus, pupils equal round and reactive, no palptable cervical adenopathy,  CV Resp: Clear to auscultation bilaterally GI: abdomen is soft, non-tender, non-distended, normal bowel sounds, no hepatosplenomegaly MSK: extremities are warm, no edema.  Skin: warm, no rash Neuro:  no focal deficits Psych: appropriate affect   Diagnostic Studies 12/2020 echo  1. Left ventricular ejection fraction, by estimation, is 65 to 70%. The  left ventricle has normal function. The left ventricle has no  regional  wall motion abnormalities. Left ventricular diastolic parameters are  consistent with Grade I diastolic  dysfunction (impaired relaxation). The average left ventricular global  longitudinal strain is -17.2 %. The global longitudinal strain is normal.   2. Right ventricular systolic function is normal. The right ventricular  size is normal.   3. The mitral valve is normal in structure. No evidence of mitral valve  regurgitation. No evidence of mitral stenosis.   4. The aortic valve is tricuspid. There is moderate calcification of the  aortic valve. There is moderate thickening of the aortic valve. Aortic  valve regurgitation is not visualized. Moderate aortic valve stenosis.  Aortic valve mean gradient measures  22.0 mmHg. Aortic valve peak gradient measures 41.2 mmHg. Aortic valve  area, by VTI measures 1.32 cm.   5. The inferior vena cava is normal in size with greater than 50%  respiratory variability, suggesting right atrial pressure of 3 mmHg.      03/2021 LE arterial US Summary:  Right: Total occlusion noted in the anterior tibial artery. Mid ATA  segment.      Left: No obstructive disease.      03/2021 ABI Summary:  Right: Resting right ankle-brachial index is within normal range. No  evidence of significant right lower extremity arterial disease. The right  toe-brachial index is abnormal.   Left: Resting left ankle-brachial index indicates mild  left lower  extremity arterial disease. The left toe-brachial index is abnormal        03/2022 echo 1. Left ventricular ejection fraction, by estimation, is 65 to 70%. The  left ventricle has normal function. Left ventricular endocardial border  not optimally defined to evaluate regional wall motion. There is mild left  ventricular hypertrophy. Left  ventricular diastolic parameters are consistent with Grade I diastolic  dysfunction (impaired relaxation).   2. Right ventricular systolic function is normal. The right ventricular  size is normal. There is normal pulmonary artery systolic pressure. The  estimated right ventricular systolic pressure is 18.7 mmHg.   3. The mitral valve is grossly normal. Trivial mitral valve  regurgitation.   4. The aortic valve is probably functionally bicuspid. There is moderate  calcification of the aortic valve. Aortic valve regurgitation is not  visualized. Moderate to severe aortic valve stenosis. Aortic valve area,  by VTI measures 1.11 cm. Aortic valve   mean gradient measures 24.6 mmHg. Dimentionless index 0.29.   5. The inferior vena cava is normal in size with greater than 50%  respiratory variability, suggesting right atrial pressure of 3 mmHg.   08/2023 echo 1. Left ventricular ejection fraction, by estimation, is 60 to 65%. The  left ventricle has normal function. The left ventricle has no regional  wall motion abnormalities. There is mild left ventricular hypertrophy.  Left ventricular diastolic parameters  are consistent with Grade I diastolic dysfunction (impaired relaxation).   2. Right ventricular systolic function is normal. The right ventricular  size is normal.   3. The mitral valve is normal in structure. No evidence of mitral valve  regurgitation. No evidence of mitral stenosis.   4. The aortic valve was not well visualized. Aortic valve regurgitation  is not visualized. Moderate aortic valve stenosis. Aortic valve mean   gradient measures 37.0 mmHg. Aortic valve Vmax measures 3.82 m/s.   5. Aortic dilatation noted. There is borderline dilatation of the aortic  root, measuring 37 mm. There is borderline dilatation of the ascending  aorta, measuring 39 mm.   Assessment and Plan  1.Aortic stenosis - moderate AS, asymptomatic -he has repeat echo scheduled for June   2. HTN - at goal, cotninue current meds   3. Hyperlipidemia - at goal, continue current meds   4. PAD - mild nonlimiting symptoms. Seen vascular, see prn if symptoms were to significantly progress - he is not interested in referral to SET program - continue medical therapy.      Antoine Poche, M.D., F.A.C.C.

## 2023-10-03 DIAGNOSIS — M47814 Spondylosis without myelopathy or radiculopathy, thoracic region: Secondary | ICD-10-CM | POA: Diagnosis not present

## 2023-10-03 DIAGNOSIS — E119 Type 2 diabetes mellitus without complications: Secondary | ICD-10-CM | POA: Diagnosis not present

## 2023-10-03 DIAGNOSIS — E782 Mixed hyperlipidemia: Secondary | ICD-10-CM | POA: Diagnosis not present

## 2023-10-03 DIAGNOSIS — K219 Gastro-esophageal reflux disease without esophagitis: Secondary | ICD-10-CM | POA: Diagnosis not present

## 2023-10-03 DIAGNOSIS — E7849 Other hyperlipidemia: Secondary | ICD-10-CM | POA: Diagnosis not present

## 2023-10-03 DIAGNOSIS — I10 Essential (primary) hypertension: Secondary | ICD-10-CM | POA: Diagnosis not present

## 2023-10-03 DIAGNOSIS — F419 Anxiety disorder, unspecified: Secondary | ICD-10-CM | POA: Diagnosis not present

## 2023-10-03 DIAGNOSIS — G894 Chronic pain syndrome: Secondary | ICD-10-CM | POA: Diagnosis not present

## 2023-10-03 DIAGNOSIS — I714 Abdominal aortic aneurysm, without rupture, unspecified: Secondary | ICD-10-CM | POA: Diagnosis not present

## 2023-10-03 DIAGNOSIS — M47816 Spondylosis without myelopathy or radiculopathy, lumbar region: Secondary | ICD-10-CM | POA: Diagnosis not present

## 2023-10-25 ENCOUNTER — Encounter: Payer: Self-pay | Admitting: *Deleted

## 2023-12-07 ENCOUNTER — Ambulatory Visit: Payer: Medicare HMO | Admitting: Cardiology

## 2024-01-03 DIAGNOSIS — H9313 Tinnitus, bilateral: Secondary | ICD-10-CM | POA: Diagnosis not present

## 2024-01-03 DIAGNOSIS — E782 Mixed hyperlipidemia: Secondary | ICD-10-CM | POA: Diagnosis not present

## 2024-01-03 DIAGNOSIS — Z6830 Body mass index (BMI) 30.0-30.9, adult: Secondary | ICD-10-CM | POA: Diagnosis not present

## 2024-01-03 DIAGNOSIS — I350 Nonrheumatic aortic (valve) stenosis: Secondary | ICD-10-CM | POA: Diagnosis not present

## 2024-01-03 DIAGNOSIS — F411 Generalized anxiety disorder: Secondary | ICD-10-CM | POA: Diagnosis not present

## 2024-01-03 DIAGNOSIS — I10 Essential (primary) hypertension: Secondary | ICD-10-CM | POA: Diagnosis not present

## 2024-01-03 DIAGNOSIS — E119 Type 2 diabetes mellitus without complications: Secondary | ICD-10-CM | POA: Diagnosis not present

## 2024-01-03 DIAGNOSIS — E7849 Other hyperlipidemia: Secondary | ICD-10-CM | POA: Diagnosis not present

## 2024-03-30 ENCOUNTER — Encounter: Payer: Self-pay | Admitting: Cardiology

## 2024-03-30 ENCOUNTER — Ambulatory Visit: Payer: Medicare HMO | Attending: Cardiology | Admitting: Cardiology

## 2024-03-30 VITALS — BP 148/84 | HR 60 | Ht 73.0 in | Wt 226.8 lb

## 2024-03-30 DIAGNOSIS — I739 Peripheral vascular disease, unspecified: Secondary | ICD-10-CM | POA: Diagnosis not present

## 2024-03-30 DIAGNOSIS — I10 Essential (primary) hypertension: Secondary | ICD-10-CM

## 2024-03-30 DIAGNOSIS — E782 Mixed hyperlipidemia: Secondary | ICD-10-CM | POA: Diagnosis not present

## 2024-03-30 DIAGNOSIS — I350 Nonrheumatic aortic (valve) stenosis: Secondary | ICD-10-CM

## 2024-03-30 NOTE — Progress Notes (Signed)
 Clinical Summary Gerald Mccann is a 63 y.o.male seen today for follow up of the following medical problems.    1.Aortic stenosis - Echocardiogram on 03/10/2017 demonstrated normal LV systolic function, LVEF 60 to 91%, moderate LVH, mild to moderate aortic stenosis, and mild aortic root and proximal ascending aortic dilatation, both of which measured 37 mm.   -12/2020 echo: LVEF 65-70%, grade I dd, normal RV, Mod AS mean grade 22 AVA VTI 1.32     03/2022 echo: LVEF 65-70%, no WMAs, grade I dd, mod AS mean grad 25 AVA VTI 1.11 - no SOB,DOE, no chest pains     08/2023 echo: LVEF 60-65%, no WMAs, grade I dd, mod AS mean grad 37 AVA VTI DI 0.32 AVA not calculated - no chest pain, no SOB/DOE, no presyncope/syncope.     2. HTN  - home bp's 130s/80s - he is compliant with meds       3.PAD - 03/2021 ABI normal right, mild left 0.82 - 03/2021 arterial US  right occluded ATA, normal left - seen by vascular 04/2021, recs to monitor at this time.F/u just as needed   - ongoing leg pains at times with walking, decreased in severity. Has been walking on treadmill. - not interested in SET program     6. Hyperlipidemia - reports recent labs with his pain doctor Dr Layman Pries in [place] - he is on pravastatin 12/2021 TC 167 TG 252 HDL 44 LDL 73 08/2022 TC 128 HDL 44 TG 128 LDL 58 10/2023 TC 132 TG 133 HDL 41 LDL 67    7. Prediabetes - last A1c 5.7    At 64 will need AAA US      Enjoys riding his motorcycle, recently sold his bike. His son had recent accident. Raising 2 grandchilder ages 20 and 51. Has one great grandbaby.  Past Medical History:  Diagnosis Date   Aortic stenosis    Arthrosis of left acromioclavicular joint    Chronic back pain    Complete rotator cuff tear of left shoulder    COPD (chronic obstructive pulmonary disease) (HCC)    Current every day smoker    smokes a pack and a half a day   Diabetes (HCC)    Gastric ulcer    Heart murmur    Hiatal hernia    HLD  (hyperlipidemia)    HTN (hypertension)    Impingement syndrome of left shoulder    Pain management contract discussed    post op pain meds to be managed by [facility] who currently manages his pain meds     Allergies  Allergen Reactions   Trelegy Ellipta [Fluticasone-Umeclidin-Vilant]     Bottom lip swelled. Required topical steroids.     Current Outpatient Medications  Medication Sig Dispense Refill   amLODipine  (NORVASC ) 10 MG tablet TAKE ONE TABLET BY MOUTH DAILY. 30 tablet 0   aspirin 81 MG tablet Take 81 mg by mouth daily.     atorvastatin (LIPITOR) 20 MG tablet Take 20 mg by mouth at bedtime.     Blood Pressure Monitor MISC Use as directed 1 each 0   busPIRone (BUSPAR) 5 MG tablet Take 5 mg by mouth 2 (two) times daily.  2   carvedilol (COREG) 3.125 MG tablet Take 3.125 mg by mouth 2 (two) times daily with a meal.     citalopram (CELEXA) 40 MG tablet Take 60 mg by mouth daily.  0   cyclobenzaprine (FLEXERIL) 10 MG tablet Take 20 mg  by mouth at bedtime.     diclofenac sodium (VOLTAREN) 1 % GEL Apply 1 application  topically 4 (four) times daily as needed (pain).     diphenhydrAMINE (BENADRYL) 25 mg capsule Take 100 mg by mouth at bedtime.     fenofibrate (TRICOR) 48 MG tablet Take 48 mg by mouth daily.     gabapentin (NEURONTIN) 100 MG capsule Take 300 mg by mouth at bedtime.     metFORMIN (GLUCOPHAGE-XR) 500 MG 24 hr tablet Take 500 mg by mouth daily with breakfast.     olmesartan (BENICAR) 40 MG tablet Take 40 mg by mouth daily.     omeprazole (PRILOSEC) 40 MG capsule Take 40 mg by mouth daily.     oxyCODONE-acetaminophen (PERCOCET) 10-325 MG tablet Take 1 tablet by mouth every 6 (six) hours.  0   RYBELSUS 7 MG TABS Take 1 tablet by mouth daily.     VENTOLIN  HFA 108 (90 Base) MCG/ACT inhaler Inhale 2 puffs into the lungs every 4 (four) hours as needed for wheezing or shortness of breath.     RYBELSUS 3 MG TABS Take 3 mg by mouth daily. (Patient not taking:  Reported on 03/30/2024)     No current facility-administered medications for this visit.     Past Surgical History:  Procedure Laterality Date   BIOPSY  03/21/2023   Procedure: BIOPSY;  Surgeon: Vinetta Greening, DO;  [facility]: [facility];  Service: Endoscopy;;   ESOPHAGOGASTRODUODENOSCOPY (EGD) WITH PROPOFOL  N/A 03/21/2023   Procedure: ESOPHAGOGASTRODUODENOSCOPY (EGD) WITH PROPOFOL ;  Surgeon: Vinetta Greening, DO;  Location: [facility];  Service: Endoscopy;  Laterality: N/A;   right knee surgery       Allergies  Allergen Reactions   Trelegy Ellipta [Fluticasone-Umeclidin-Vilant]     Bottom lip swelled. Required topical steroids.      Family History  Problem Relation Age of Onset   Coronary artery disease Mother    Coronary artery disease Father    Heart attack Father    Heart attack Brother 56       deceased   Asthma Other      Social History Gerald Mccann reports that he has been smoking cigarettes. He started smoking about 51 years ago. He has a 77.4 pack-year smoking history. He has never been exposed to tobacco smoke. He has never used smokeless tobacco. Gerald Mccann reports current alcohol  use.    Physical Examination Today's Vitals   03/30/24 0822 03/30/24 0831 03/30/24 0854  BP: (!) 152/88 (!) 162/98 (!) 148/84  Pulse: 60    SpO2: 98%    Weight: 226 lb 12.8 oz (102.9 kg)    Height: 6\' 1"  (1.854 m)     Body mass index is 29.92 kg/m.  Gen: resting comfortably, no acute distress HEENT: no scleral icterus, pupils equal round and reactive, no palptable cervical adenopathy,  CV: RRR, no mrg, no jvd Resp: Clear to auscultation bilaterally GI: abdomen is soft, non-tender, non-distended, normal bowel sounds, no hepatosplenomegaly MSK: extremities are warm, no edema.  Skin: warm, no rash Neuro:  no focal deficits Psych: appropriate affect   Diagnostic Studies  12/2020 echo  1. Left ventricular ejection fraction, by estimation, is 65 to 70%. The  left  ventricle has normal function. The left ventricle has no regional  wall motion abnormalities. Left ventricular diastolic parameters are  consistent with Grade I diastolic  dysfunction (impaired relaxation). The average left ventricular global  longitudinal strain is -17.2 %. The global longitudinal strain is  normal.   2. Right ventricular systolic function is normal. The right ventricular  size is normal.   3. The mitral valve is normal in structure. No evidence of mitral valve  regurgitation. No evidence of mitral stenosis.   4. The aortic valve is tricuspid. There is moderate calcification of the  aortic valve. There is moderate thickening of the aortic valve. Aortic  valve regurgitation is not visualized. Moderate aortic valve stenosis.  Aortic valve mean gradient measures  22.0 mmHg. Aortic valve peak gradient measures 41.2 mmHg. Aortic valve  area, by VTI measures 1.32 cm.   5. The inferior vena cava is normal in size with greater than 50%  respiratory variability, suggesting right atrial pressure of 3 mmHg.      03/2021 LE arterial US  Summary:  Right: Total occlusion noted in the anterior tibial artery. Mid ATA  segment.      Left: No obstructive disease.      03/2021 ABI Summary:  Right: Resting right ankle-brachial index is within normal range. No  evidence of significant right lower extremity arterial disease. The right  toe-brachial index is abnormal.   Left: Resting left ankle-brachial index indicates mild left lower  extremity arterial disease. The left toe-brachial index is abnormal        03/2022 echo 1. Left ventricular ejection fraction, by estimation, is 65 to 70%. The  left ventricle has normal function. Left ventricular endocardial border  not optimally defined to evaluate regional wall motion. There is mild left  ventricular hypertrophy. Left  ventricular diastolic parameters are consistent with Grade I diastolic  dysfunction (impaired relaxation).   2.  Right ventricular systolic function is normal. The right ventricular  size is normal. There is normal pulmonary artery systolic pressure. The  estimated right ventricular systolic pressure is 18.7 mmHg.   3. The mitral valve is grossly normal. Trivial mitral valve  regurgitation.   4. The aortic valve is probably functionally bicuspid. There is moderate  calcification of the aortic valve. Aortic valve regurgitation is not  visualized. Moderate to severe aortic valve stenosis. Aortic valve area,  by VTI measures 1.11 cm. Aortic valve   mean gradient measures 24.6 mmHg. Dimentionless index 0.29.   5. The inferior vena cava is normal in size with greater than 50%  respiratory variability, suggesting right atrial pressure of 3 mmHg.    08/2023 echo 1. Left ventricular ejection fraction, by estimation, is 60 to 65%. The  left ventricle has normal function. The left ventricle has no regional  wall motion abnormalities. There is mild left ventricular hypertrophy.  Left ventricular diastolic parameters  are consistent with Grade I diastolic dysfunction (impaired relaxation).   2. Right ventricular systolic function is normal. The right ventricular  size is normal.   3. The mitral valve is normal in structure. No evidence of mitral valve  regurgitation. No evidence of mitral stenosis.   4. The aortic valve was not well visualized. Aortic valve regurgitation  is not visualized. Moderate aortic valve stenosis. Aortic valve mean  gradient measures 37.0 mmHg. Aortic valve Vmax measures 3.82 m/s.   5. Aortic dilatation noted. There is borderline dilatation of the aortic  root, measuring 37 mm. There is borderline dilatation of the ascending  aorta, measuring 39 mm.     Assessment and Plan  1.Aortic stenosis - moderate AS that has been asymptomatic though gradually progressing by echo sureveillance - repeat echo 08/2024   2. HTN - elevated but home numbers at goal, continue current meds  3. Hyperlipidemia - at goal, continue current meds   4. PAD - mild nonlimiting symptoms. Seen vascular, see prn if symptoms were to significantly progress - he is not interested in referral to SET program - he will continue medical therapy at this time  EKG shows SR, first degree av block   F/u 6 months      Laurann Pollock, M.D.

## 2024-03-30 NOTE — Patient Instructions (Signed)
 Medication Instructions:   Continue all current medications.   Labwork:  none  Testing/Procedures:  Your physician has requested that you have an echocardiogram. Echocardiography is a painless test that uses sound waves to create images of your heart. It provides your doctor with information about the size and shape of your heart and how well your heart's chambers and valves are working. This procedure takes approximately one hour. There are no restrictions for this procedure. Please do NOT wear cologne, perfume, aftershave, or lotions (deodorant is allowed). Please arrive 15 minutes prior to your appointment time.  Please note: We ask at that you not bring children with you during ultrasound (echo/ vascular) testing. Due to room size and safety concerns, children are not allowed in the ultrasound rooms during exams. Our front office staff cannot provide observation of children in our lobby area while testing is being conducted. An adult accompanying a patient to their appointment will only be allowed in the ultrasound room at the discretion of the ultrasound technician under special circumstances. We apologize for any inconvenience. DUE OCTOBER 2025  Follow-Up:  6 months   Any Other Special Instructions Will Be Listed Below (If Applicable).   If you need a refill on your cardiac medications before your next appointment, please call your pharmacy.

## 2024-03-30 NOTE — Addendum Note (Signed)
 Addended by: Dakisha Schoof G on: 03/30/2024 09:12 AM   Modules accepted: Orders

## 2024-04-03 DIAGNOSIS — E7849 Other hyperlipidemia: Secondary | ICD-10-CM | POA: Diagnosis not present

## 2024-04-03 DIAGNOSIS — F411 Generalized anxiety disorder: Secondary | ICD-10-CM | POA: Diagnosis not present

## 2024-04-03 DIAGNOSIS — I350 Nonrheumatic aortic (valve) stenosis: Secondary | ICD-10-CM | POA: Diagnosis not present

## 2024-04-03 DIAGNOSIS — I10 Essential (primary) hypertension: Secondary | ICD-10-CM | POA: Diagnosis not present

## 2024-04-03 DIAGNOSIS — H9313 Tinnitus, bilateral: Secondary | ICD-10-CM | POA: Diagnosis not present

## 2024-04-03 DIAGNOSIS — E119 Type 2 diabetes mellitus without complications: Secondary | ICD-10-CM | POA: Diagnosis not present

## 2024-04-03 DIAGNOSIS — E782 Mixed hyperlipidemia: Secondary | ICD-10-CM | POA: Diagnosis not present

## 2024-04-03 DIAGNOSIS — Z6830 Body mass index (BMI) 30.0-30.9, adult: Secondary | ICD-10-CM | POA: Diagnosis not present

## 2024-05-10 DIAGNOSIS — Z6830 Body mass index (BMI) 30.0-30.9, adult: Secondary | ICD-10-CM | POA: Diagnosis not present

## 2024-05-10 DIAGNOSIS — M47814 Spondylosis without myelopathy or radiculopathy, thoracic region: Secondary | ICD-10-CM | POA: Diagnosis not present

## 2024-05-10 DIAGNOSIS — M25522 Pain in left elbow: Secondary | ICD-10-CM | POA: Diagnosis not present

## 2024-05-10 DIAGNOSIS — G894 Chronic pain syndrome: Secondary | ICD-10-CM | POA: Diagnosis not present

## 2024-06-28 DIAGNOSIS — I10 Essential (primary) hypertension: Secondary | ICD-10-CM | POA: Diagnosis not present

## 2024-06-28 DIAGNOSIS — M47814 Spondylosis without myelopathy or radiculopathy, thoracic region: Secondary | ICD-10-CM | POA: Diagnosis not present

## 2024-06-28 DIAGNOSIS — M25522 Pain in left elbow: Secondary | ICD-10-CM | POA: Diagnosis not present

## 2024-06-28 DIAGNOSIS — G894 Chronic pain syndrome: Secondary | ICD-10-CM | POA: Diagnosis not present

## 2024-06-28 DIAGNOSIS — Z6830 Body mass index (BMI) 30.0-30.9, adult: Secondary | ICD-10-CM | POA: Diagnosis not present

## 2024-08-06 ENCOUNTER — Ambulatory Visit: Attending: Cardiology

## 2024-08-06 DIAGNOSIS — I350 Nonrheumatic aortic (valve) stenosis: Secondary | ICD-10-CM | POA: Diagnosis not present

## 2024-08-06 LAB — ECHOCARDIOGRAM COMPLETE
AR max vel: 0.78 cm2
AV Area VTI: 0.89 cm2
AV Area mean vel: 0.81 cm2
AV Mean grad: 51.5 mmHg
AV Peak grad: 88.7 mmHg
Ao pk vel: 4.71 m/s
Area-P 1/2: 2.12 cm2
Calc EF: 68.1 %
MV VTI: 3.7 cm2
S' Lateral: 2.6 cm
Single Plane A2C EF: 61.1 %
Single Plane A4C EF: 73.5 %

## 2024-08-09 DIAGNOSIS — R21 Rash and other nonspecific skin eruption: Secondary | ICD-10-CM | POA: Diagnosis not present

## 2024-08-09 DIAGNOSIS — M545 Low back pain, unspecified: Secondary | ICD-10-CM | POA: Diagnosis not present

## 2024-08-09 DIAGNOSIS — F411 Generalized anxiety disorder: Secondary | ICD-10-CM | POA: Diagnosis not present

## 2024-08-09 DIAGNOSIS — Z7952 Long term (current) use of systemic steroids: Secondary | ICD-10-CM | POA: Diagnosis not present

## 2024-08-09 DIAGNOSIS — F320 Major depressive disorder, single episode, mild: Secondary | ICD-10-CM | POA: Diagnosis not present

## 2024-08-09 DIAGNOSIS — Z79891 Long term (current) use of opiate analgesic: Secondary | ICD-10-CM | POA: Diagnosis not present

## 2024-08-09 DIAGNOSIS — J449 Chronic obstructive pulmonary disease, unspecified: Secondary | ICD-10-CM | POA: Diagnosis not present

## 2024-08-09 DIAGNOSIS — E119 Type 2 diabetes mellitus without complications: Secondary | ICD-10-CM | POA: Diagnosis not present

## 2024-08-09 DIAGNOSIS — I10 Essential (primary) hypertension: Secondary | ICD-10-CM | POA: Diagnosis not present

## 2024-08-09 DIAGNOSIS — G8929 Other chronic pain: Secondary | ICD-10-CM | POA: Diagnosis not present

## 2024-08-09 DIAGNOSIS — E785 Hyperlipidemia, unspecified: Secondary | ICD-10-CM | POA: Diagnosis not present

## 2024-08-09 DIAGNOSIS — F17210 Nicotine dependence, cigarettes, uncomplicated: Secondary | ICD-10-CM | POA: Diagnosis not present

## 2024-08-09 DIAGNOSIS — K219 Gastro-esophageal reflux disease without esophagitis: Secondary | ICD-10-CM | POA: Diagnosis not present

## 2024-08-17 DIAGNOSIS — Z6830 Body mass index (BMI) 30.0-30.9, adult: Secondary | ICD-10-CM | POA: Diagnosis not present

## 2024-08-17 DIAGNOSIS — Z2089 Contact with and (suspected) exposure to other communicable diseases: Secondary | ICD-10-CM | POA: Diagnosis not present

## 2024-08-17 DIAGNOSIS — J441 Chronic obstructive pulmonary disease with (acute) exacerbation: Secondary | ICD-10-CM | POA: Diagnosis not present

## 2024-08-17 DIAGNOSIS — Z20828 Contact with and (suspected) exposure to other viral communicable diseases: Secondary | ICD-10-CM | POA: Diagnosis not present

## 2024-10-04 DIAGNOSIS — E7849 Other hyperlipidemia: Secondary | ICD-10-CM | POA: Diagnosis not present

## 2024-10-04 DIAGNOSIS — E119 Type 2 diabetes mellitus without complications: Secondary | ICD-10-CM | POA: Diagnosis not present

## 2024-10-04 DIAGNOSIS — F411 Generalized anxiety disorder: Secondary | ICD-10-CM | POA: Diagnosis not present

## 2024-10-04 DIAGNOSIS — I714 Abdominal aortic aneurysm, without rupture, unspecified: Secondary | ICD-10-CM | POA: Diagnosis not present

## 2024-10-04 DIAGNOSIS — M25522 Pain in left elbow: Secondary | ICD-10-CM | POA: Diagnosis not present

## 2024-10-04 DIAGNOSIS — I350 Nonrheumatic aortic (valve) stenosis: Secondary | ICD-10-CM | POA: Diagnosis not present

## 2024-10-04 DIAGNOSIS — I10 Essential (primary) hypertension: Secondary | ICD-10-CM | POA: Diagnosis not present

## 2024-10-04 DIAGNOSIS — M47814 Spondylosis without myelopathy or radiculopathy, thoracic region: Secondary | ICD-10-CM | POA: Diagnosis not present

## 2024-10-04 DIAGNOSIS — G894 Chronic pain syndrome: Secondary | ICD-10-CM | POA: Diagnosis not present

## 2024-11-26 NOTE — Progress Notes (Signed)
 " Cardiology Office Note:  .   Date:  11/26/2024  ID:  Gerald Mccann, DOB 30-Nov-1960, MRN [id number] PCP: Lari Elspeth BRAVO, MD  [facility] Providers Cardiologist:  Alvan Carrier, MD {  History of Present Illness: Gerald Mccann is a 64 y.o. male  with PMHx of Aortic Stenosis,  Aortic Dilation, HTN, PAD, Carotid Stenosis, HLD, prediabetes who reports to [facility] office for follow up.   Pertinent cardiac medical history:  Aortic Stenosis/Aortic Dilation Echo 03/2017: mild to moderate aortic stenosis, and mild aortic root and proximal ascending aortic dilatation, both of which measured 37 mm  Echo 12/2020, 03/2022, 08/2023: Moderate AS  Echo 08/2024: severe AS, mild dilation of ascending aorta at 40 mm Carotid Stenosis  Carotid US  06/2018: 1-39% bilateral stenosis PAD  03/2021 ABI normal right, mild left 0.82 03/2021 arterial US  right occluded ATA, normal left Seen by Vascular in 04/2021 who recommend f/u PRN  Last seen in heartcare 03/2024 by Dr. Alvan for follow up. Reported mild non limiting PAD symptoms but other wise doing well. Continued on amlodipine  10 mg daily, ASA 81 mg daily, Lipitor 20 mg daily, Coreg 3.125 mg BID, Fenofibrate 48 mg daily, Olmesartan 40 mg daily. Follow up echo 10/2024 was overall normal except progressive AS as above.  Today, accompanied by wife. Reports intermittent crampy feeling 8/10 in chest pain for years lasting for about 5 mins occurring every 2-3 months associated with diaphoresis and SOB occurring at rest.  Wife notes concern about chest pain and has previously encouraged ED visit but patient declined.  Denies any exertional symptoms, palpitations, syncope, presyncope, dizziness, orthopnea, PND, swelling or significant weight changes, acute bleeding, or claudication. Reports compliance with medications. He is able to walk around the grocery or department stores without any concerns.  He is raising 2 grandchildren ages 72 and 34.  He smokes 1 PPD and  has previously tried tobacco cessation medication but has been unsuccessful due to side effects. Denies alcohol /drug use.   ROS: 10 point review of system has been reviewed and considered negative except ones been listed in the HPI.   Studies Reviewed: Gerald    ECHO IMPRESSIONS 08/06/2024  1. Left ventricular ejection fraction, by estimation, is 70 to 75%. The  left ventricle has hyperdynamic function. The left ventricle has no  regional wall motion abnormalities. There is mild concentric left  ventricular hypertrophy. Left ventricular  diastolic parameters are indeterminate.   2. Right ventricular systolic function is normal. The right ventricular  size is normal. Tricuspid regurgitation signal is inadequate for assessing  PA pressure.   3. The mitral valve is grossly normal. Trivial mitral valve  regurgitation.   4. The aortic valve was not well visualized, likely bicuspid. There is  moderate calcification of the aortic valve. Aortic valve regurgitation is  not visualized. Severe aortic valve stenosis. Aortic valve mean gradient  measures 51.5 mmHg. Aortic valve  Vmax measures 4.71 m/s. Dimentionless indez 0.26.   5. Aortic dilatation noted. There is mild dilatation of the ascending  aorta, measuring 40 mm.   6. The inferior vena cava is normal in size with greater than 50%  respiratory variability, suggesting right atrial pressure of 3 mmHg.   Comparison(s): A prior study was performed on 08/02/2023. Prior images  reviewed side by side. Severe aortic stenosis with likely bicuspid AV,  mean gradient 51 mmHg and DI 0.26. Ascending aorta mildly dilated at 40  mm.  CV Studies: Cardiac studies reviewed are  outlined and summarized above. Otherwise please see EMR for full report.   Physical Exam:   VS:  There were no vitals taken for this visit.   Wt Readings from Last 3 Encounters:  03/30/24 226 lb 12.8 oz (102.9 kg)  03/21/23 249 lb 12.5 oz (113.3 kg)  03/16/23 249 lb 12.8 oz (113.3  kg)    GEN: Well nourished, well developed in no acute distress while sitting in chair.  NECK: No JVD; No carotid bruits CARDIAC: RRR, no rubs, gallops; 4/6 systolic murmur in L/RUSB RESPIRATORY:  Clear to auscultation without rales, wheezing or rhonchi  ABDOMEN: Soft, non-tender, non-distended EXTREMITIES:  No edema; No deformity   ASSESSMENT AND PLAN: .   Aortic valve stenosis, etiology of cardiac valve disease unspecified ECHO 08/2024: Severe AS  Reports atypical chest pain and SOB. Unclear if 2/2 to AV stenosis or CAD.  Systolic harsh 4/6 best heard on LUSB Referral to structural heart for evaluation and discussion of AVR/TAVR candidacy. Educated patient and spouse on red-flag symptoms (exertional chest pain, syncope, worsening dyspnea) and advised ED evaluation if symptoms worsen or become exertional.   Chest pain, unspecified type SHORTNESS OF BREATH Intermittent, non-exertional CP episodes with diaphoresis; concern for cardiac etiology in setting of severe AS and multiple risk factors. EKG today: NSR without acute ischemic changes.  Ordered NTG as needed Discussed ER precautions. No previous ischemic evaluations in the past.  Will discuss with Dr. Alvan if we should consider ischemic evaluation (cardiac CTA vs cath) or wait until structural heart referral to determine if cardiac cath will be needed to avoid excessive contrast exposure. Per Dr. Alvan, can await recommendations from Structural heart team.   Hyperlipidemia LDL goal <70 Order FLP.  Continue Lipitor 20 mg daily and fenofibrate  48 mg daily.   Carotid stenosis, bilateral Carotid US  06/2018: 1-39% bilateral stenosis No bruits on exam Order carotid US .  PAD (peripheral artery disease) Mild left PAD (ABI 0.82) with occluded right ATA; currently asymptomatic. Continue aspirin 81 mg daily and atorvastatin 20 mg daily.  Essential hypertension BP  136/78 today.  Order CMP.  Continue amlodipine  10 mg daily, Coreg  3.125 mg twice daily, olmesartan 40 mg daily.  Screening for thyroid disorder Patient requested annual labs. Order TSH.   Tobacco Use  Current smoker (~1 PPD); prior intolerance to cessation pharmacotherapy Counseled extensively on smoking cessation; Recommend following up with PCP to discuss additional options.   Dispo: Follow up in 3 months.   Signed, Lorette CINDERELLA Kapur, PA-C  "

## 2024-11-27 ENCOUNTER — Ambulatory Visit: Attending: Physician Assistant | Admitting: Physician Assistant

## 2024-11-27 ENCOUNTER — Encounter: Payer: Self-pay | Admitting: Physician Assistant

## 2024-11-27 VITALS — BP 136/78 | HR 72 | Ht 73.0 in | Wt 220.8 lb

## 2024-11-27 DIAGNOSIS — Z1329 Encounter for screening for other suspected endocrine disorder: Secondary | ICD-10-CM

## 2024-11-27 DIAGNOSIS — R0602 Shortness of breath: Secondary | ICD-10-CM

## 2024-11-27 DIAGNOSIS — I10 Essential (primary) hypertension: Secondary | ICD-10-CM

## 2024-11-27 DIAGNOSIS — I6523 Occlusion and stenosis of bilateral carotid arteries: Secondary | ICD-10-CM | POA: Diagnosis not present

## 2024-11-27 DIAGNOSIS — I739 Peripheral vascular disease, unspecified: Secondary | ICD-10-CM

## 2024-11-27 DIAGNOSIS — Z79899 Other long term (current) drug therapy: Secondary | ICD-10-CM | POA: Diagnosis not present

## 2024-11-27 DIAGNOSIS — R079 Chest pain, unspecified: Secondary | ICD-10-CM | POA: Diagnosis not present

## 2024-11-27 DIAGNOSIS — I350 Nonrheumatic aortic (valve) stenosis: Secondary | ICD-10-CM

## 2024-11-27 DIAGNOSIS — E785 Hyperlipidemia, unspecified: Secondary | ICD-10-CM

## 2024-11-27 MED ORDER — NITROGLYCERIN 0.4 MG SL SUBL: 0.4000 mg | SUBLINGUAL_TABLET | SUBLINGUAL | 3 refills | Status: AC | PRN

## 2024-11-27 NOTE — Patient Instructions (Addendum)
 Medication Instructions:  Your physician recommends that you continue on your current medications as directed. Please refer to the Current Medication list given to you today.  Labwork: Your physician recommends that you return for a FASTING lipid profile, CMP, CBC, & TSH. Please do not eat or drink for at least 8 hours when you have this done. You may take your medications that morning with a sip of water. [employer] ([address] [place])  Testing/Procedures: Your physician has requested that you have a carotid duplex. This test is an ultrasound of the carotid arteries in your neck. It looks at blood flow through these arteries that supply the brain with blood. Allow one hour for this exam. There are no restrictions or special instructions.  Follow-Up: Your physician recommends that you schedule a follow-up appointment in: 3 months  Any Other Special Instructions Will Be Listed Below (If Applicable). You have been referred to Structural [facility] Clinic.   If you need a refill on your cardiac medications before your next appointment, please call your pharmacy.

## 2024-11-30 ENCOUNTER — Ambulatory Visit

## 2024-12-03 ENCOUNTER — Ambulatory Visit

## 2024-12-04 ENCOUNTER — Ambulatory Visit: Payer: Self-pay | Admitting: Physician Assistant

## 2024-12-04 ENCOUNTER — Ambulatory Visit

## 2024-12-04 DIAGNOSIS — I6523 Occlusion and stenosis of bilateral carotid arteries: Secondary | ICD-10-CM | POA: Diagnosis not present

## 2024-12-05 ENCOUNTER — Encounter: Payer: Self-pay | Admitting: Cardiovascular Disease

## 2024-12-05 ENCOUNTER — Ambulatory Visit: Admitting: Cardiovascular Disease

## 2024-12-05 VITALS — BP 136/80 | HR 68 | Ht 73.0 in | Wt 220.8 lb

## 2024-12-05 DIAGNOSIS — I350 Nonrheumatic aortic (valve) stenosis: Secondary | ICD-10-CM | POA: Diagnosis not present

## 2024-12-05 NOTE — Patient Instructions (Signed)
 Medication Instructions:  No medication changes were made at this visit. Continue current regimen.   *If you need a refill on your cardiac medications before your next appointment, please call your pharmacy*  Lab Work: Please complete labs ordered at visit with Dena Kapur, PA NO LATER than 12/17/24  If you have labs (blood work) drawn today and your tests are completely normal, you will receive your results only by: MyChart Message (if you have MyChart) OR A paper copy in the mail If you have any lab test that is abnormal or we need to change your treatment, we will call you to review the results.  Testing/Procedures: None ordered today.  Other Instructions       Cardiac/Peripheral Catheterization   You are scheduled for a Cardiac Catheterization on Tuesday, February 24 with Dr. Ozell Fell.  1. Please arrive at the [facility] (Main Entrance A) at [facility]: [address] [place], [place] [address] at 10:00 AM (This time is 2 hour(s) before your procedure to ensure your preparation). Your procedure is scheduled at 12 PM.  Free valet parking service is available. You will check in at ADMITTING. The support person will be asked to wait in the waiting room.  It is OK to have someone drop you off and come back when you are ready to be discharged.        Special note: Every effort is made to have your procedure done on time. Please understand that emergencies sometimes delay scheduled procedures.  2. Diet: Nothing to eat after midnight.  3. Hydration:You need to be well hydrated before your procedure. On February 24, you may drink approved liquids (see below) until 2 hours before the procedure, with 16 oz of water as your last intake.   List of approved liquids water, clear juice, clear tea, black coffee, fruit juices, non-citric and without pulp, carbonated beverages, Gatorade, Kool -Aid, plain Jello-O and plain ice popsicles.  4. Labs: You will need to have blood  drawn no later than 12/17/24.  5. Medication instructions in preparation for your procedure:   Contrast Allergy: No  Stop taking, Benicar (Olmesartan) Monday, February 23, last dose will be on Sunday, February 22.  Do not take Diabetes Med Glucophage (Metformin) on the day of the procedure and HOLD 48 HOURS AFTER THE PROCEDURE.  On the morning of your procedure, take Aspirin 81 mg and any morning medicines NOT listed above.  You may use sips of water.  6. Plan to go home the same day, you will only stay overnight if medically necessary. 7. You MUST have a responsible adult to drive you home. 8. An adult MUST be with you the first 24 hours after you arrive home. 9. Bring a current list of your medications, and the last time and date medication taken. 10. Bring ID and current insurance cards. 11.Please wear clothes that are easy to get on and off and wear slip-on shoes.  Thank you for allowing us  to care for you!   -- [facility]

## 2024-12-05 NOTE — Progress Notes (Unsigned)
 " Cardiology Office Note:    Date:  12/07/2024   ID:  Gerald Mccann, DOB 21-Jul-1961, MRN [id number]  PCP:  [facility]   [facility] Providers Cardiologist:  Alvan Carrier, MD     Referring MD: [facility]   Chief Complaint  Patient presents with   Aortic Stenosis    History of Present Illness:    Gerald Mccann is a 64 y.o. male referred by Lorette Kapur, PA, for evaluation of aortic stenosis.  The patient has been followed for aortic stenosis for many years.  His echo studies have demonstrated progression of his aortic stenosis over the past 5 years with most recent echo 08/06/2024 demonstrating hyperdynamic LVEF of 70 to 75% with mild concentric LVH, normal RV function, and difficult imaging of the aortic valve felt to likely be bicuspid.  The valve is moderately calcified with severe aortic stenosis, mean gradient 52 mmHg, V-max 4.7 m/s, dimensionless index 0.26.  The patient is married and they are raising 2 of their grandchildren who are ages 75 and 37.  He is a 1 pack/day smoker.  Comorbid medical conditions include peripheral arterial disease, hypertension, and hyperlipidemia. The patient is here with his wife today. He has known of a heart murmur for the past decade and he has understood that he has a progressive problem. He occasionally has a 'spell' when he feels a 'cramp' in the right chest. These episodes last about 5-10 minutes. He also has shortness of breath with walking or any moderate level of activity, progressively worse over the past year. No lightheadedness or dizziness.   Past Medical History:  Diagnosis Date   Aortic stenosis    Arthrosis of left acromioclavicular joint    Chronic back pain    Complete rotator cuff tear of left shoulder    COPD (chronic obstructive pulmonary disease) (HCC)    Current every day smoker    smokes a pack and a half a day   Diabetes (HCC)    Gastric ulcer    Heart murmur    Hiatal  hernia    HLD (hyperlipidemia)    HTN (hypertension)    Impingement syndrome of left shoulder    Pain management contract discussed    post op pain meds to be managed by [facility] who currently manages his pain meds   Past Surgical History:  Procedure Laterality Date   BIOPSY  03/21/2023   Procedure: BIOPSY;  Surgeon: Cindie Carlin POUR, DO;  Location: [facility];  Service: Endoscopy;;   ESOPHAGOGASTRODUODENOSCOPY (EGD) WITH PROPOFOL  N/A 03/21/2023   Procedure: ESOPHAGOGASTRODUODENOSCOPY (EGD) WITH PROPOFOL ;  Surgeon: Cindie Carlin POUR, DO;  Location: [facility];  Service: Endoscopy;  Laterality: N/A;   right knee surgery      Current Medications: Active Medications[1]   Allergies:   Trelegy ellipta [fluticasone-umeclidin-vilant]   ROS:   Please see the history of present illness.    All other systems reviewed and are negative.  EKGs/Labs/Other Studies Reviewed:    The following studies were reviewed today: Cardiac Studies & Procedures   ______________________________________________________________________________________________     ECHOCARDIOGRAM  ECHOCARDIOGRAM COMPLETE 08/06/2024  Narrative ECHOCARDIOGRAM REPORT    Patient Name:   Gerald Mccann Date of Exam: 08/06/2024 Medical Rec #:  [id number]       Height:       73.0 in Accession #:    [id number]      Weight:       226.8 lb Date  of Birth:  28-Aug-1961       BSA:          2.270 m Patient Age:    62 years        BP:           148/84 mmHg Patient Gender: M               HR:           64 bpm. Exam Location:  [place]  Procedure: 2D Echo, Cardiac Doppler and Color Doppler (Both Spectral and Color Flow Doppler were utilized during procedure).  Indications:    I35.0 Nonrheumatic aortic (valve) stenosis  History:        Patient has prior history of Echocardiogram examinations, most recent 08/02/2023. Aortic Valve Disease, Signs/Symptoms:Chest Pain, Shortness of Breath and Murmur; Risk  Factors:Current Smoker.  Sonographer:    Bascom Burows RCS, RVS Referring Phys: [id number] DORN FALCON BRANCH   Sonographer Comments: Technically difficult study due to poor echo windows. Low parasternal window. Global longitudinal strain was attempted. IMPRESSIONS   1. Left ventricular ejection fraction, by estimation, is 70 to 75%. The left ventricle has hyperdynamic function. The left ventricle has no regional wall motion abnormalities. There is mild concentric left ventricular hypertrophy. Left ventricular diastolic parameters are indeterminate. 2. Right ventricular systolic function is normal. The right ventricular size is normal. Tricuspid regurgitation signal is inadequate for assessing PA pressure. 3. The mitral valve is grossly normal. Trivial mitral valve regurgitation. 4. The aortic valve was not well visualized, likely bicuspid. There is moderate calcification of the aortic valve. Aortic valve regurgitation is not visualized. Severe aortic valve stenosis. Aortic valve mean gradient measures 51.5 mmHg. Aortic valve Vmax measures 4.71 m/s. Dimentionless indez 0.26. 5. Aortic dilatation noted. There is mild dilatation of the ascending aorta, measuring 40 mm. 6. The inferior vena cava is normal in size with greater than 50% respiratory variability, suggesting right atrial pressure of 3 mmHg.  Comparison(s): A prior study was performed on 08/02/2023. Prior images reviewed side by side. Severe aortic stenosis with likely bicuspid AV, mean gradient 51 mmHg and DI 0.26. Ascending aorta mildly dilated at 40 mm.  FINDINGS Left Ventricle: Left ventricular ejection fraction, by estimation, is 70 to 75%. The left ventricle has hyperdynamic function. The left ventricle has no regional wall motion abnormalities. The left ventricular internal cavity size was normal in size. There is mild concentric left ventricular hypertrophy. Left ventricular diastolic parameters are indeterminate.  Right  Ventricle: The right ventricular size is normal. No increase in right ventricular wall thickness. Right ventricular systolic function is normal. Tricuspid regurgitation signal is inadequate for assessing PA pressure.  Left Atrium: Left atrial size was normal in size.  Right Atrium: Right atrial size was normal in size.  Pericardium: There is no evidence of pericardial effusion.  Mitral Valve: The mitral valve is grossly normal. Trivial mitral valve regurgitation. MV peak gradient, 3.2 mmHg. The mean mitral valve gradient is 1.0 mmHg.  Tricuspid Valve: The tricuspid valve is grossly normal. Tricuspid valve regurgitation is trivial.  Aortic Valve: The aortic valve was not well visualized. There is moderate calcification of the aortic valve. There is moderate aortic valve annular calcification. Aortic valve regurgitation is not visualized. Severe aortic stenosis is present. Aortic valve mean gradient measures 51.5 mmHg. Aortic valve peak gradient measures 88.7 mmHg. Aortic valve area, by VTI measures 0.89 cm.  Pulmonic Valve: The pulmonic valve was not well visualized. Pulmonic valve regurgitation is trivial.  Aorta:  Aortic dilatation noted. There is mild dilatation of the ascending aorta, measuring 40 mm.  Venous: The inferior vena cava is normal in size with greater than 50% respiratory variability, suggesting right atrial pressure of 3 mmHg.  IAS/Shunts: No atrial level shunt detected by color flow Doppler.  Additional Comments: 3D was performed not requiring image post processing on an independent workstation and was indeterminate.   LEFT VENTRICLE PLAX 2D LVIDd:         5.20 cm     Diastology LVIDs:         2.60 cm     LV e' medial:    4.35 cm/s LV PW:         1.20 cm     LV E/e' medial:  14.0 LV IVS:        1.20 cm     LV e' lateral:   5.33 cm/s LVOT diam:     2.10 cm     LV E/e' lateral: 11.4 LV SV:         91 LV SV Index:   40 LVOT Area:     3.46 cm  LV Volumes (MOD) LV  vol d, MOD A2C: 65.1 ml LV vol d, MOD A4C: 78.1 ml LV vol s, MOD A2C: 25.3 ml LV vol s, MOD A4C: 20.7 ml LV SV MOD A2C:     39.8 ml LV SV MOD A4C:     78.1 ml LV SV MOD BP:      49.5 ml  RIGHT VENTRICLE RV Basal diam:  3.30 cm     PULMONARY VEINS RV Mid diam:    3.30 cm     Diastolic Velocity: 42.20 cm/s RV S prime:     12.00 cm/s  S/D Velocity:       1.30 TAPSE (M-mode): 2.3 cm      Systolic Velocity:  56.50 cm/s  LEFT ATRIUM             Index        RIGHT ATRIUM           Index LA Vol (A2C):   62.2 ml 27.40 ml/m  RA Area:     13.20 cm LA Vol (A4C):   36.2 ml 15.95 ml/m  RA Volume:   31.80 ml  14.01 ml/m LA Biplane Vol: 46.4 ml 20.44 ml/m AORTIC VALVE                     PULMONIC VALVE AV Area (Vmax):    0.78 cm      PV Vmax:       0.63 m/s AV Area (Vmean):   0.81 cm      PV Peak grad:  1.6 mmHg AV Area (VTI):     0.89 cm AV Vmax:           471.00 cm/s AV Vmean:          333.000 cm/s AV VTI:            1.018 m AV Peak Grad:      88.7 mmHg AV Mean Grad:      51.5 mmHg LVOT Vmax:         105.50 cm/s LVOT Vmean:        78.350 cm/s LVOT VTI:          0.262 m LVOT/AV VTI ratio: 0.26  AORTA Ao Root diam: 3.70 cm Ao Asc diam:  4.00 cm  MITRAL VALVE MV Area (PHT): 2.12 cm  SHUNTS MV Area VTI:   3.70 cm    Systemic VTI:  0.26 m MV Peak grad:  3.2 mmHg    Systemic Diam: 2.10 cm MV Mean grad:  1.0 mmHg MV Vmax:       0.90 m/s MV Vmean:      47.6 cm/s MV Decel Time: 358 msec MV E velocity: 60.80 cm/s MV A velocity: 99.80 cm/s MV E/A ratio:  0.61  Jayson Sierras MD Electronically signed by Jayson Sierras MD Signature Date/Time: 08/06/2024/12:38:18 PM    Final          ______________________________________________________________________________________________      EKG:        Recent Labs: No results found for requested labs within last 365 days.  Recent Lipid Panel No results found for: CHOL, TRIG, HDL, CHOLHDL, VLDL, LDLCALC,  LDLDIRECT        Physical Exam:    VS:  BP 136/80 (BP Location: Left Arm, Patient Position: Sitting, Cuff Size: Large)   Pulse 68   Ht 6' 1 (1.854 m)   Wt 220 lb 12.8 oz (100.2 kg)   SpO2 96%   BMI 29.13 kg/m     Wt Readings from Last 3 Encounters:  12/05/24 220 lb 12.8 oz (100.2 kg)  11/27/24 220 lb 12.8 oz (100.2 kg)  03/30/24 226 lb 12.8 oz (102.9 kg)     GEN:  Well nourished, well developed in no acute distress HEENT: Normal NECK: No JVD; No carotid bruits LYMPHATICS: No lymphadenopathy CARDIAC: RRR, 3/6 harsh crescendo decrescendo murmur heard throughout most prominent at the apex RESPIRATORY:  Clear to auscultation without rales, wheezing or rhonchi  ABDOMEN: Soft, non-tender, non-distended MUSCULOSKELETAL:  No edema; No deformity  SKIN: Warm and dry NEUROLOGIC:  Alert and oriented x 3 PSYCHIATRIC:  Normal affect   Assessment & Plan Nonrheumatic aortic (valve) stenosis The patient has severe, stage D1 aortic stenosis.  At his relatively young age, I suspect he has a bicuspid aortic valve.  He describes NYHA functional class 2 symptoms of exertional dyspnea and fatigue. Based on his age, I have a high suspicion that he may have a bicuspid aortic valve. Pertinent comorbid conditions include tobacco use and type II diabetes.   I have reviewed the natural history of aortic stenosis with the patient and their family members who are present today. We have discussed the limitations of medical therapy and the poor prognosis associated with symptomatic aortic stenosis. We have reviewed potential treatment options, including palliative medical therapy, conventional surgical aortic valve replacement, and transcatheter aortic valve replacement. We discussed treatment options in the context of the patient's specific comorbid medical conditions.    Further workup is indicated and should include cardiac catheterization and possible PCI. I have reviewed the risks, indications, and  alternatives to cardiac catheterization, possible angioplasty, and stenting with the patient. Risks include but are not limited to bleeding, infection, vascular injury, stroke, myocardial infection, arrhythmia, kidney injury, radiation-related injury in the case of prolonged fluoroscopy use, emergency cardiac surgery, and death. The patient understands the risks of serious complication is 1-2 in 1000 with diagnostic cardiac cath and 1-2% or less with angioplasty/stenting.  Once cardiac cath is completed, the patient will be referred for a gated CTA of the heart and CTA of the chest/abdomen/pelvis to further assess cardiac and vascular anatomy. After his studies are completed, he will be referred for formal cardiac surgical consultation as part of a multidisciplinary approach to his care. He understands at age 89, that he might  be best treated with conventional surgical aortic valve replacement. Will await his study results and multidisciplinary discussion before making a final recommendation.        Informed Consent   Shared Decision Making/Informed Consent The risks [stroke (1 in 1000), death (1 in 1000), kidney failure [usually temporary] (1 in 500), bleeding (1 in 200), allergic reaction [possibly serious] (1 in 200)], benefits (diagnostic support and management of coronary artery disease) and alternatives of a cardiac catheterization were discussed in detail with Mr. Haggart and he is willing to proceed.       Medication Adjustments/Labs and Tests Ordered: Current medicines are reviewed at length with the patient today.  Concerns regarding medicines are outlined above.  No orders of the defined types were placed in this encounter.  No orders of the defined types were placed in this encounter.   Patient Instructions  Medication Instructions:  No medication changes were made at this visit. Continue current regimen.   *If you need a refill on your cardiac medications before your next  appointment, please call your pharmacy*  Lab Work: Please complete labs ordered at visit with Dena Kapur, PA NO LATER than 12/17/24  If you have labs (blood work) drawn today and your tests are completely normal, you will receive your results only by: MyChart Message (if you have MyChart) OR A paper copy in the mail If you have any lab test that is abnormal or we need to change your treatment, we will call you to review the results.  Testing/Procedures: None ordered today.  Other Instructions       Cardiac/Peripheral Catheterization   You are scheduled for a Cardiac Catheterization on Tuesday, February 24 with Dr. Ozell Fell.  1. Please arrive at the [facility] (Main Entrance A) at [facility]: [address] [place], [place] [address] at 10:00 AM (This time is 2 hour(s) before your procedure to ensure your preparation). Your procedure is scheduled at 12 PM.  Free valet parking service is available. You will check in at ADMITTING. The support person will be asked to wait in the waiting room.  It is OK to have someone drop you off and come back when you are ready to be discharged.        Special note: Every effort is made to have your procedure done on time. Please understand that emergencies sometimes delay scheduled procedures.  2. Diet: Nothing to eat after midnight.  3. Hydration:You need to be well hydrated before your procedure. On February 24, you may drink approved liquids (see below) until 2 hours before the procedure, with 16 oz of water as your last intake.   List of approved liquids water, clear juice, clear tea, black coffee, fruit juices, non-citric and without pulp, carbonated beverages, Gatorade, Kool -Aid, plain Jello-O and plain ice popsicles.  4. Labs: You will need to have blood drawn no later than 12/17/24.  5. Medication instructions in preparation for your procedure:   Contrast Allergy: No  Stop taking, Benicar (Olmesartan) Monday,  February 23, last dose will be on Sunday, February 22.  Do not take Diabetes Med Glucophage (Metformin) on the day of the procedure and HOLD 48 HOURS AFTER THE PROCEDURE.  On the morning of your procedure, take Aspirin 81 mg and any morning medicines NOT listed above.  You may use sips of water.  6. Plan to go home the same day, you will only stay overnight if medically necessary. 7. You MUST have a responsible adult to  drive you home. 8. An adult MUST be with you the first 24 hours after you arrive home. 9. Bring a current list of your medications, and the last time and date medication taken. 10. Bring ID and current insurance cards. 11.Please wear clothes that are easy to get on and off and wear slip-on shoes.  Thank you for allowing us  to care for you!   -- [facility]     Signed, Ozell Fell, MD  12/07/2024 5:14 AM    [facility]     [1]  Current Meds  Medication Sig   amLODipine  (NORVASC ) 10 MG tablet TAKE ONE TABLET BY MOUTH DAILY.   aspirin 81 MG tablet Take 81 mg by mouth daily.   atorvastatin (LIPITOR) 20 MG tablet Take 20 mg by mouth at bedtime.   Blood Pressure Monitor MISC Use as directed   busPIRone (BUSPAR) 5 MG tablet Take 5 mg by mouth 2 (two) times daily.   carvedilol (COREG) 3.125 MG tablet Take 3.125 mg by mouth 2 (two) times daily with a meal.   citalopram (CELEXA) 40 MG tablet Take 60 mg by mouth daily.   cyclobenzaprine (FLEXERIL) 10 MG tablet Take 20 mg by mouth at bedtime.   diclofenac sodium (VOLTAREN) 1 % GEL Apply 1 application  topically 4 (four) times daily as needed (pain).   diphenhydrAMINE (BENADRYL) 25 mg capsule Take 100 mg by mouth at bedtime.   fenofibrate (TRICOR) 48 MG tablet Take 48 mg by mouth daily.   gabapentin (NEURONTIN) 100 MG capsule Take 300 mg by mouth at bedtime.   metFORMIN (GLUCOPHAGE-XR) 500 MG 24 hr tablet Take 500 mg by mouth daily with breakfast.   nitroGLYCERIN  (NITROSTAT ) 0.4  MG SL tablet Place 1 tablet (0.4 mg total) under the tongue every 5 (five) minutes x 3 doses as needed (if no relief after 2nd dose, proceed to the ED or call 911).   olmesartan (BENICAR) 40 MG tablet Take 40 mg by mouth daily.   omeprazole (PRILOSEC) 40 MG capsule Take 40 mg by mouth daily.   Oxycodone HCl 10 MG TABS Take 10 mg by mouth 4 (four) times daily.   RYBELSUS 7 MG TABS Take 1 tablet by mouth daily.   VENTOLIN  HFA 108 (90 Base) MCG/ACT inhaler Inhale 2 puffs into the lungs every 4 (four) hours as needed for wheezing or shortness of breath.   "

## 2024-12-25 ENCOUNTER — Encounter (HOSPITAL_COMMUNITY): Payer: Self-pay

## 2024-12-25 ENCOUNTER — Ambulatory Visit (HOSPITAL_COMMUNITY): Admit: 2024-12-25 | Admitting: Cardiovascular Disease

## 2025-02-25 ENCOUNTER — Ambulatory Visit: Admitting: Nurse Practitioner
